# Patient Record
Sex: Female | Born: 1990 | Race: White | Hispanic: No | Marital: Single | Smoking: Never smoker
Health system: Southern US, Community
[De-identification: ages and names within clinical notes are randomized; demographics above are authoritative.]

## PROBLEM LIST (undated history)

## (undated) HISTORY — PX: WISDOM TOOTH EXTRACTION: SHX21

---
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
## 2012-01-05 IMAGING — CR DG CERVICAL SPINE COMPLETE 4+V
5 series · 5 of 5 positions shown · non-contrast
Comparison: None.

CLINICAL DATA: Neck pain left arm numbness.  No known injury.

CERVICAL SPINE - COMPLETE 4+ VIEW

[view not recorded (1 of 5)]
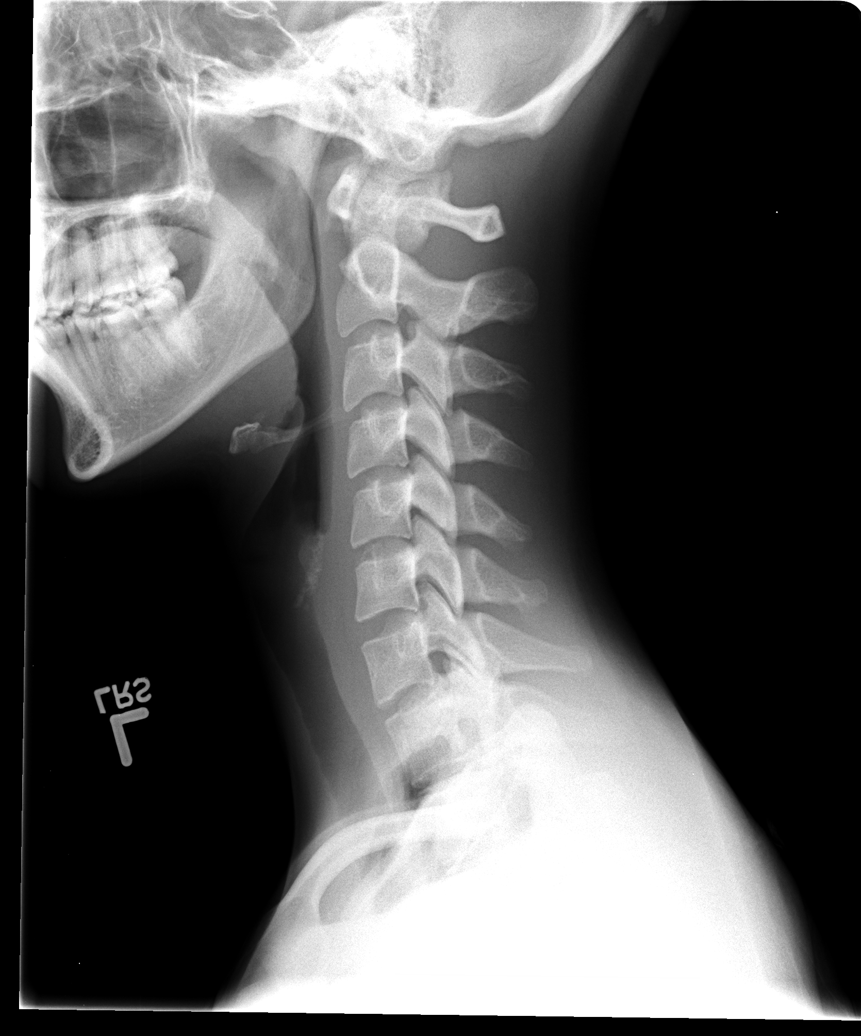

[view not recorded (2 of 5)]
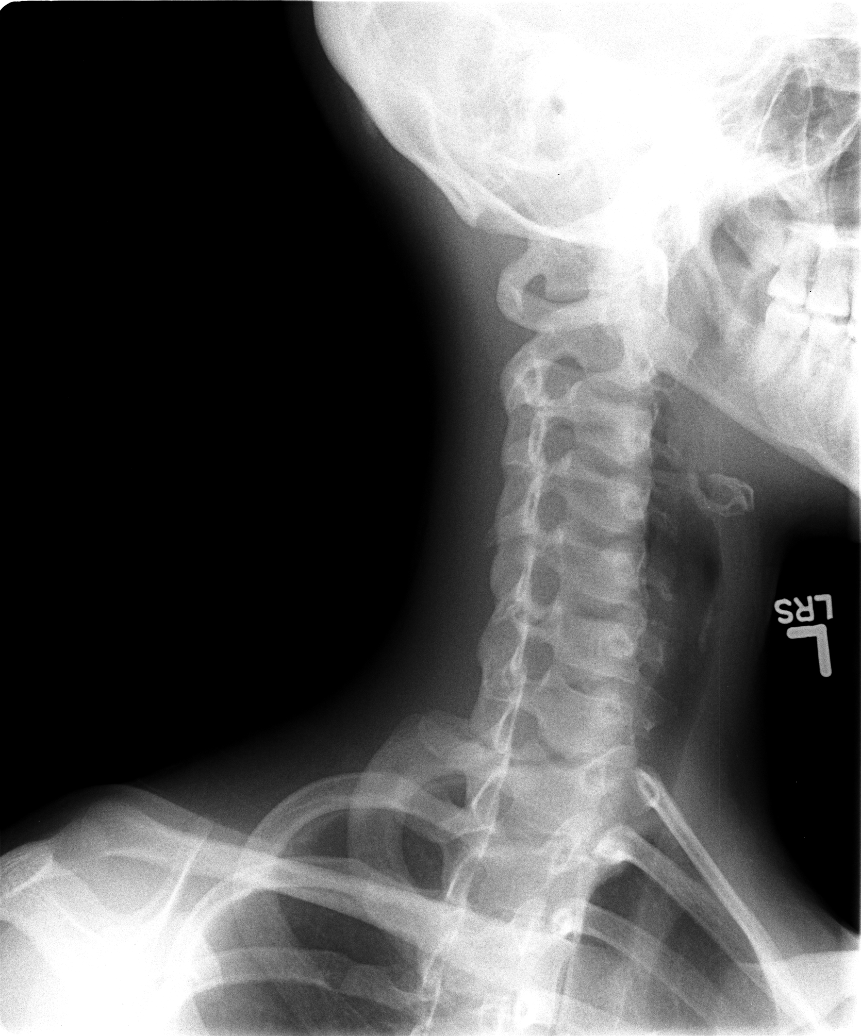

[view not recorded (3 of 5)]
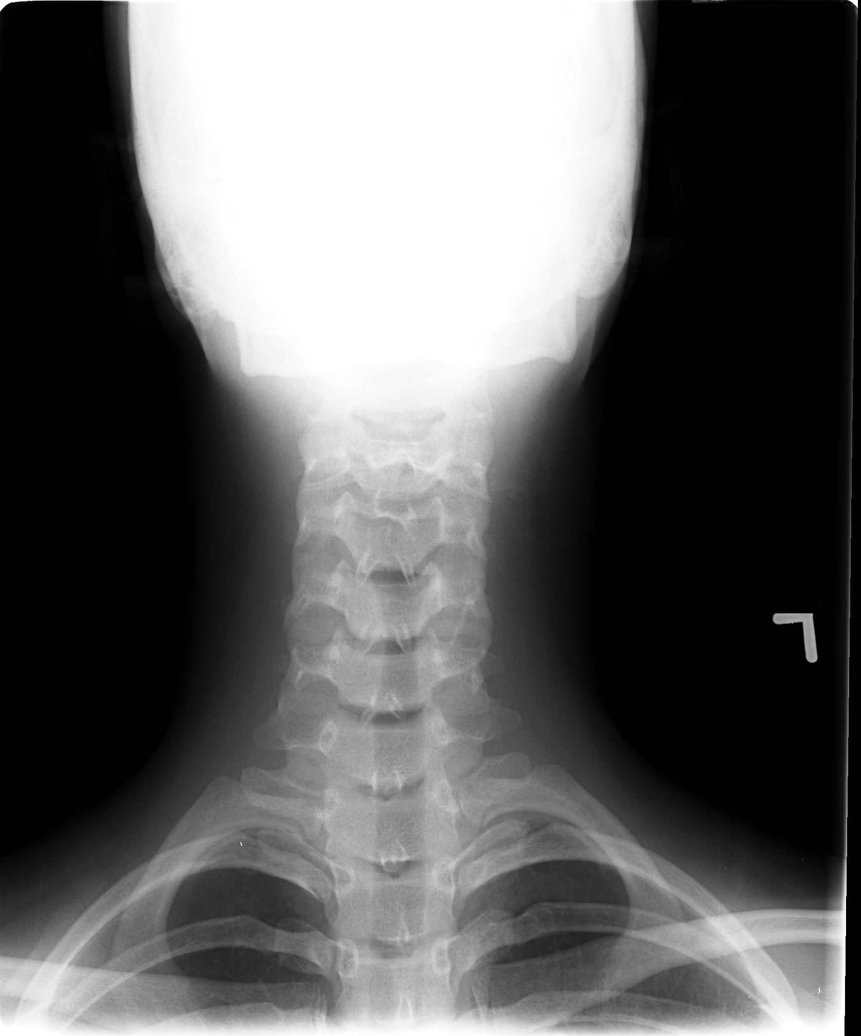

[view not recorded (4 of 5)]
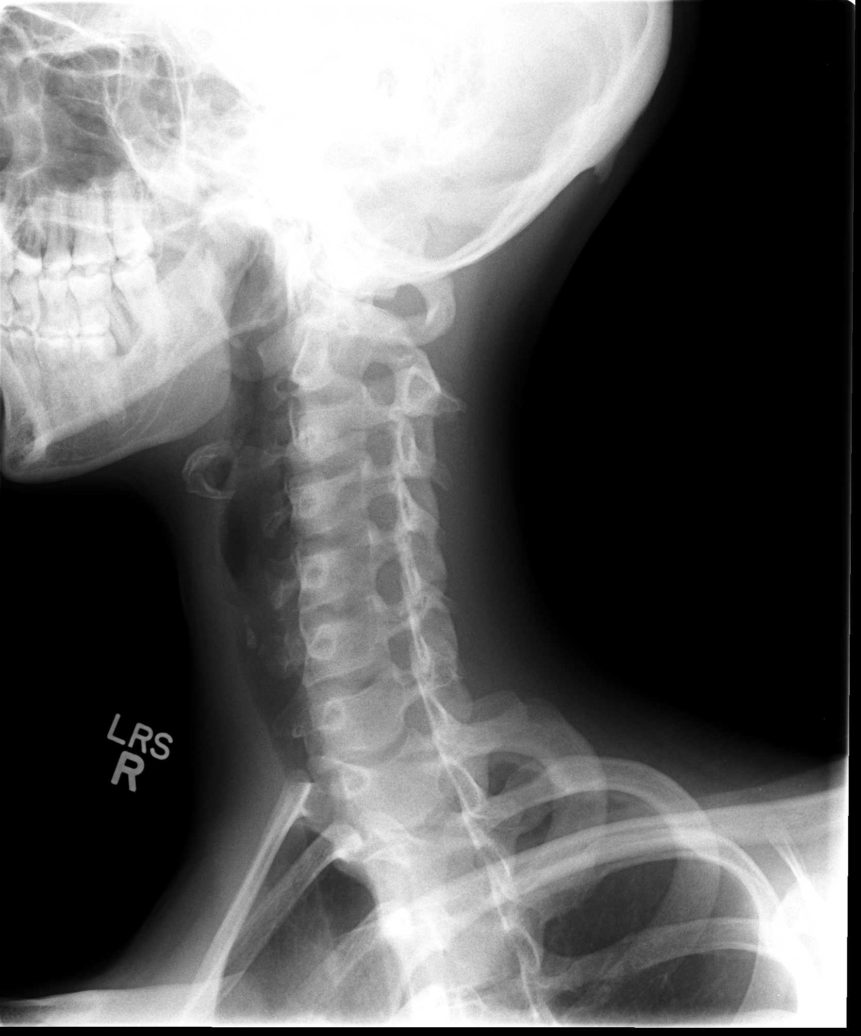

[view not recorded (5 of 5)]
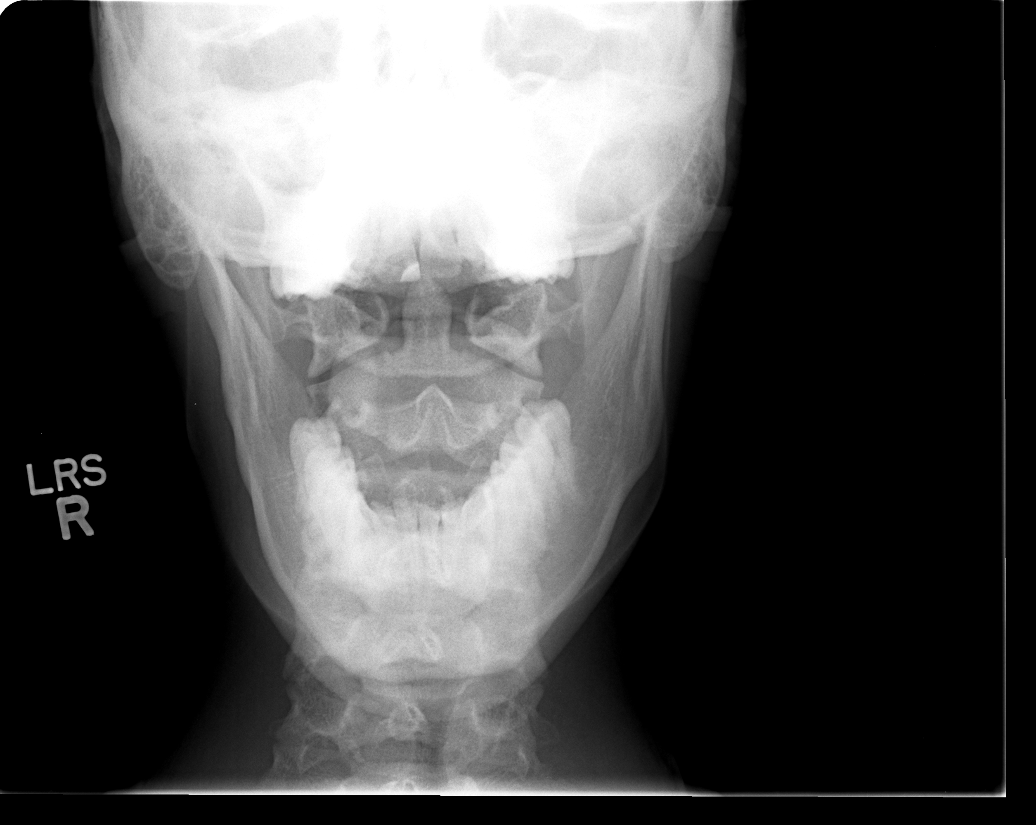

[5 of 5 positions shown; findings below may reference images not displayed]

FINDINGS: Normal appearing bones and soft tissues.  No bony
foraminal stenosis seen.
IMPRESSION: Normal examination.

## 2015-09-28 ENCOUNTER — Encounter (HOSPITAL_COMMUNITY): Payer: Self-pay | Admitting: Emergency Medicine

## 2015-09-28 ENCOUNTER — Emergency Department (HOSPITAL_COMMUNITY)
Admission: EM | Admit: 2015-09-28 | Discharge: 2015-09-28 | Disposition: A | Discharge status: Home / Self Care | Payer: Self-pay | Attending: Emergency Medicine | Admitting: Emergency Medicine

## 2015-09-28 DIAGNOSIS — Z3202 Encounter for pregnancy test, result negative: Secondary | ICD-10-CM | POA: Insufficient documentation

## 2015-09-28 DIAGNOSIS — R42 Dizziness and giddiness: Secondary | ICD-10-CM | POA: Insufficient documentation

## 2015-09-28 DIAGNOSIS — R0602 Shortness of breath: Secondary | ICD-10-CM | POA: Insufficient documentation

## 2015-09-28 DIAGNOSIS — H539 Unspecified visual disturbance: Secondary | ICD-10-CM | POA: Insufficient documentation

## 2015-09-28 DIAGNOSIS — R55 Syncope and collapse: Secondary | ICD-10-CM | POA: Insufficient documentation

## 2015-09-28 DIAGNOSIS — R5383 Other fatigue: Secondary | ICD-10-CM | POA: Insufficient documentation

## 2015-09-28 DIAGNOSIS — R531 Weakness: Secondary | ICD-10-CM | POA: Insufficient documentation

## 2015-09-28 LAB — BASIC METABOLIC PANEL
Anion gap: 8 (ref 5–15)
BUN: 13 mg/dL (ref 6–20)
CO2: 26 mmol/L (ref 22–32)
CREATININE: 0.64 mg/dL (ref 0.44–1.00)
Calcium: 9.1 mg/dL (ref 8.9–10.3)
Chloride: 104 mmol/L (ref 101–111)
GFR calc Af Amer: >60 mL/min (ref >60)
GLUCOSE: 90 mg/dL (ref 65–99)
Potassium: 3.6 mmol/L (ref 3.5–5.1)
SODIUM: 138 mmol/L (ref 135–145)

## 2015-09-28 LAB — CBC
HEMATOCRIT: 42.8 % (ref 36.0–46.0)
Hemoglobin: 14.1 g/dL (ref 12.0–15.0)
MCH: 30.4 pg (ref 26.0–34.0)
MCHC: 32.9 g/dL (ref 30.0–36.0)
MCV: 92.2 fL (ref 78.0–100.0)
PLATELETS: 224 10*3/uL (ref 150–400)
RBC: 4.64 MIL/uL (ref 3.87–5.11)
RDW: 13.1 % (ref 11.5–15.5)
WBC: 6.7 10*3/uL (ref 4.0–10.5)

## 2015-09-28 LAB — URINALYSIS, ROUTINE W REFLEX MICROSCOPIC
BILIRUBIN URINE: NEGATIVE
GLUCOSE, UA: NEGATIVE mg/dL
HGB URINE DIPSTICK: NEGATIVE
LEUKOCYTES UA: NEGATIVE
Nitrite: NEGATIVE
PROTEIN: NEGATIVE mg/dL
Specific Gravity, Urine: 1.015 (ref 1.005–1.030)
pH: 6.5 (ref 5.0–8.0)

## 2015-09-28 LAB — CBG MONITORING, ED
GLUCOSE-CAPILLARY: 86 mg/dL (ref 65–99)
Glucose-Capillary: 79 mg/dL (ref 65–99)

## 2015-09-28 LAB — PREGNANCY, URINE: Preg Test, Ur: NEGATIVE

## 2015-09-28 NOTE — ED Provider Notes (Signed)
CSN: [id number]     Arrival date & time 09/28/15  1431 History   First MD Initiated Contact with Patient 09/28/15 1542     Chief Complaint  Patient presents with  . Near Syncope     (Consider location/radiation/quality/duration/timing/severity/associated sxs/prior Treatment) Patient is a 24 y.o. female presenting with near-syncope. The history is provided by the patient.  Near Syncope Associated symptoms include shortness of breath. Pertinent negatives include no chest pain, no abdominal pain and no headaches.   patient works at [employer]. While at work patient started feeling like the room was spinning and she was going to pass out also found it hard to breathe. Reportedly blood sugar was checked there and it was over 200 but upon arrival here it was 86. Patient does not have a history of diabetes. Patient now feels back to normal other than feeling sort of fatigue. Patient denies any chest pain and shortness of breath now any abdominal pain. Any upper respiratory infection symptoms.  History reviewed. No pertinent past medical history. History reviewed. No pertinent past surgical history. History reviewed. No pertinent family history. Social History  Substance Use Topics  . Smoking status: Never Smoker   . Smokeless tobacco: None  . Alcohol Use: No   OB History    No data available     Review of Systems  Constitutional: Positive for fatigue. Negative for fever.  HENT: Negative for congestion and sore throat.   Eyes: Positive for visual disturbance.  Respiratory: Positive for shortness of breath.   Cardiovascular: Positive for near-syncope. Negative for chest pain.  Gastrointestinal: Negative for nausea, vomiting and abdominal pain.  Genitourinary: Negative for dysuria.  Musculoskeletal: Negative for myalgias.  Skin: Negative for rash.  Neurological: Positive for dizziness, weakness and light-headedness. Negative for syncope and headaches.  Hematological: Does not  bruise/bleed easily.  Psychiatric/Behavioral: Negative for confusion.      Allergies  Other  Home Medications   Prior to Admission medications   Not on File   BP 118/85 mmHg  Pulse 74  Temp(Src) 97.9 F (36.6 C) (Oral)  Resp 13  Ht 6' (1.829 m)  Wt 68.04 kg  BMI 20.34 kg/m2  SpO2 100%  LMP 09/12/2015 Physical Exam  Constitutional: She is oriented to person, place, and time. She appears well-developed and well-nourished. No distress.  HENT:  Head: Normocephalic and atraumatic.  Mouth/Throat: Oropharynx is clear and moist.  Eyes: Conjunctivae and EOM are normal. Pupils are equal, round, and reactive to light.  Neck: Normal range of motion. Neck supple.  Cardiovascular: Normal rate and normal heart sounds.   No murmur heard. Pulmonary/Chest: Effort normal and breath sounds normal. No respiratory distress.  Abdominal: Soft. Bowel sounds are normal. There is no tenderness.  Musculoskeletal: Normal range of motion. She exhibits no edema.  Neurological: She is alert and oriented to person, place, and time. No cranial nerve deficit. She exhibits normal muscle tone. Coordination normal.  Skin: Skin is warm. No rash noted.  Nursing note and vitals reviewed.   ED Course  Procedures (including critical care time) Labs Review Labs Reviewed  URINALYSIS, ROUTINE W REFLEX MICROSCOPIC (NOT AT [facility]) - Abnormal; Notable for the following:    Ketones, ur TRACE (*)    All other components within normal limits  BASIC METABOLIC PANEL  CBC  PREGNANCY, URINE  CBG MONITORING, ED  CBG MONITORING, ED   Results for orders placed or performed during the hospital encounter of 09/28/15  Basic metabolic panel  Result Value Ref Range  Sodium 138 135 - 145 mmol/L   Potassium 3.6 3.5 - 5.1 mmol/L   Chloride 104 101 - 111 mmol/L   CO2 26 22 - 32 mmol/L   Glucose, Bld 90 65 - 99 mg/dL   BUN 13 6 - 20 mg/dL   Creatinine, Ser 6.290.64 0.44 - 1.00 mg/dL   Calcium 9.1 8.9 - 52.810.3 mg/dL   GFR calc  non Af Amer >60 >60 mL/min   GFR calc Af Amer >60 >60 mL/min   Anion gap 8 5 - 15  CBC  Result Value Ref Range   WBC 6.7 4.0 - 10.5 K/uL   RBC 4.64 3.87 - 5.11 MIL/uL   Hemoglobin 14.1 12.0 - 15.0 g/dL   HCT 41.342.8 24.436.0 - 01.046.0 %   MCV 92.2 78.0 - 100.0 fL   MCH 30.4 26.0 - 34.0 pg   MCHC 32.9 30.0 - 36.0 g/dL   RDW 27.213.1 53.611.5 - 64.415.5 %   Platelets 224 150 - 400 K/uL  Urinalysis, Routine w reflex microscopic (not at [facility])  Result Value Ref Range   Color, Urine YELLOW YELLOW   APPearance CLEAR CLEAR   Specific Gravity, Urine 1.015 1.005 - 1.030   pH 6.5 5.0 - 8.0   Glucose, UA NEGATIVE NEGATIVE mg/dL   Hgb urine dipstick NEGATIVE NEGATIVE   Bilirubin Urine NEGATIVE NEGATIVE   Ketones, ur TRACE (A) NEGATIVE mg/dL   Protein, ur NEGATIVE NEGATIVE mg/dL   Nitrite NEGATIVE NEGATIVE   Leukocytes, UA NEGATIVE NEGATIVE  Pregnancy, urine  Result Value Ref Range   Preg Test, Ur NEGATIVE NEGATIVE  CBG monitoring, ED  Result Value Ref Range   Glucose-Capillary 86 65 - 99 mg/dL  CBG monitoring, ED  Result Value Ref Range   Glucose-Capillary 79 65 - 99 mg/dL     Imaging Review No results found. I have personally reviewed and evaluated these images and lab results as part of my medical decision-making.   EKG Interpretation   Date/Time:  Monday September 28 2015 16:16:35 EST Ventricular Rate:  67 PR Interval:  89 QRS Duration: 98 QT Interval:  406 QTC Calculation: 429 R Axis:   68 Text Interpretation:  Sinus rhythm Short PR interval RSR' in V1 or V2,  probably normal variant No previous ECGs available Confirmed by Kassidee Narciso   MD, Jaquin Coy [phone]([id number]) on 09/28/2015 4:20:29 PM      MDM   Final diagnoses:  Near syncope    Patient works at [employer]. Started feeling bad while at work at some room spinning and felt as if she was going to pass out. She found very hard to breathe. Here workup without any acute findings. Patient's now asymptomatic.  Lab test negative no signs of urinary  tract infection no anemia no leukocytosis no electrolyte abnormalities. EKG without any acute findings. Patient stable for discharge home will return for any recurrent symptoms. Prednisone test negative.    Vanetta MuldersScott Osha Rane, MD 09/28/15 [phone]

## 2015-09-28 NOTE — ED Notes (Signed)
Pt states that she was at work and started feeling like the room was spinning and that she was going to pass out.  States that it felt very hard to breathe.  Glucose was checked pta and was over 200.  CBG 86 in triage.

## 2015-09-28 NOTE — Discharge Instructions (Signed)
Return for any new worse or recurrent symptoms. Today's workup without any significant findings.   Near-Syncope Near-syncope (commonly known as near fainting) is sudden weakness, dizziness, or feeling like you might pass out. This can happen when getting up or while standing for a long time. It is caused by a sudden decrease in blood flow to the brain, which can occur for various reasons. Most of the reasons are not serious.  HOME CARE Watch your condition for any changes.  Have someone stay with you until you feel stable.  If you feel like you are going to pass out:  Lie down right away.  Prop your feet up if you can.  Breathe deeply and steadily.  Move only when the feeling has gone away. Most of the time, this feeling lasts only a few minutes. You may feel tired for several hours.  Drink enough fluids to keep your pee (urine) clear or pale yellow.  If you are taking blood pressure or heart medicine, stand up slowly.  Follow up with your doctor as told. GET HELP RIGHT AWAY IF:   You have a severe headache.  You have unusual pain in the chest, belly (abdomen), or back.  You have bleeding from the mouth or butt (rectum), or you have black or tarry poop (stool).  You feel your heart beat differently than normal, or you have a very fast pulse.  You pass out, or you twitch and shake when you pass out.  You pass out when sitting or lying down.  You feel confused.  You have trouble walking.  You are weak.  You have vision problems. MAKE SURE YOU:   Understand these instructions.  Will watch your condition.  Will get help right away if you are not doing well or get worse.   This information is not intended to replace advice given to you by your health care provider. Make sure you discuss any questions you have with your health care provider.   Document Released: 04/11/2008 Document Revised: 11/14/2014 Document Reviewed: 03/29/2013 Elsevier Interactive Patient  Education [employer]2016 Elsevier Inc.

## 2016-08-01 ENCOUNTER — Emergency Department (HOSPITAL_COMMUNITY)
Admission: EM | Admit: 2016-08-01 | Discharge: 2016-08-01 | Disposition: A | Discharge status: Home / Self Care | Payer: Medicaid Other | Attending: Emergency Medicine | Admitting: Emergency Medicine

## 2016-08-01 ENCOUNTER — Encounter (HOSPITAL_COMMUNITY): Payer: Self-pay | Admitting: Emergency Medicine

## 2016-08-01 DIAGNOSIS — Y929 Unspecified place or not applicable: Secondary | ICD-10-CM | POA: Diagnosis not present

## 2016-08-01 DIAGNOSIS — W57XXXA Bitten or stung by nonvenomous insect and other nonvenomous arthropods, initial encounter: Secondary | ICD-10-CM | POA: Insufficient documentation

## 2016-08-01 DIAGNOSIS — Y999 Unspecified external cause status: Secondary | ICD-10-CM | POA: Diagnosis not present

## 2016-08-01 DIAGNOSIS — Y939 Activity, unspecified: Secondary | ICD-10-CM | POA: Diagnosis not present

## 2016-08-01 DIAGNOSIS — L03116 Cellulitis of left lower limb: Secondary | ICD-10-CM | POA: Insufficient documentation

## 2016-08-01 DIAGNOSIS — S70362A Insect bite (nonvenomous), left thigh, initial encounter: Secondary | ICD-10-CM | POA: Diagnosis present

## 2016-08-01 NOTE — ED Triage Notes (Addendum)
PT states she got bite by ants on Saturday and went to Urgent care yesterday and was started on Clindamycin for red/raised area to back of left upper leg. PT denies any fevers. PT also states LMP was 07/02/16 with positive home pregnancy test.

## 2016-08-01 NOTE — ED Provider Notes (Signed)
[facility]-EMERGENCY DEPT Provider Note   CSN: [id number] Arrival date & time: 08/01/16  1739  By signing my name below, I, [name], attest that this documentation has been prepared under the direction and in the presence of Burgess AmorJulie Margret Moat, PA-C Electronically Signed: [name], ED Scribe. 08/01/16. 7:25 PM.    History   Chief Complaint Chief Complaint  Patient presents with  . Insect Bite   HPI  Kathy HaleBrianna M Austin is a 25 y.o. female who presents to the Emergency Department complaining of an insect bite onset yesterday morning which is itchy and tender. Pt denies feeling anything biting her, but  was possibly bit by ants to her posterior left upper leg while outside. Pt reports that she went to an urgent care yesterday and was Rx clindamycin for her symptoms. Pt states that the provider that she saw didn't I&D the area due to the area being hard. Pt notes that since she has begun the abx Rx, the area to her posterior left upper leg has become soft.  Pt reports that she has tried Rx clindamycin for the relief of her symptoms. Pt denies fever, chills, wound, and any other symptoms. Pt states that she is currently [redacted] weeks pregnant with her first OB appointment being 08/17/2016.     The history is provided by the patient. No language interpreter was used.    History reviewed. No pertinent past medical history.  There are no active problems to display for this patient.   Past Surgical History:  Procedure Laterality Date  . WISDOM TOOTH EXTRACTION      OB History    Gravida Para Term Preterm AB Living   3         1   SAB TAB Ectopic Multiple Live Births                   Home Medications    Prior to Admission medications   Not on File    Family History History reviewed. No pertinent family history.  Social History Social History  Substance Use Topics  . Smoking status: Never Smoker  . Smokeless tobacco: Never Used  . Alcohol use No     Allergies    Other   Review of Systems Review of Systems  Constitutional: Negative for chills and fever.  Skin: Positive for color change. Negative for wound.       Insect bite to posterior left upper thigh and right lower leg     Physical Exam Updated Vital Signs BP 160/99 (BP Location: Left Arm)   Pulse 92   Temp 98.2 F (36.8 C) (Oral)   Resp 16   Ht 6' (1.829 m)   Wt 66.7 kg   LMP 07/02/2016 (Approximate) Comment: positive tests at home  SpO2 99%   BMI 19.94 kg/m   Physical Exam  Constitutional: She appears well-developed and well-nourished. No distress.  HENT:  Head: Normocephalic.  Neck: Neck supple.  Cardiovascular: Normal rate.   Pulmonary/Chest: Effort normal. She has no wheezes.  Musculoskeletal: Normal range of motion. She exhibits no edema.  Skin: There is erythema.  Left posterior mid thigh with 11 cm circle of mild erythema with central darker punctum that is not draining, indurated, or fluctuant. Two small vesicles at central portion. No red streaking. 0.5 cm small papule at right medial ankle. No drainage. No fluctuance.      ED Treatments / Results  DIAGNOSTIC STUDIES: Oxygen Saturation is 99% on RA, nl by my interpretation.  COORDINATION OF CARE: 7:21 PM Discussed treatment plan with pt at bedside which includes continue with abx and pt agreed to plan.   Procedures Procedures (including critical care time)  Medications Ordered in ED Medications - No data to display   Initial Impression / Assessment and Plan / ED Course  I have reviewed the triage vital signs and the nursing notes.   Clinical Course     Pt advised to continue using abx Rx given ytd.  Pt can use topical menthol cream (like Gold [employer]) for itching and yogurt to minimize risk of diarrhea from abx. Wound edges marked with skin marker.  Advised recheck for any spreading redness. Heat tx to site.    Area is soft, not fluctuant, no abscess at this time.   Final Clinical Impressions(s)  / ED Diagnoses   Final diagnoses:  Cellulitis of left lower extremity    New Prescriptions There are no discharge medications for this patient.  I personally performed the services described in this documentation, which was scribed in my presence. The recorded information has been reviewed and is accurate.     Burgess Amor, PA-C 08/03/16 [id number]    Shaune Pollack, MD 08/03/16 [phone]

## 2023-10-13 ENCOUNTER — Other Ambulatory Visit: Payer: Self-pay

## 2023-10-13 ENCOUNTER — Inpatient Hospital Stay (HOSPITAL_COMMUNITY)
Admission: EM | Admit: 2023-10-13 | Discharge: 2023-10-16 | DRG: 872 | Disposition: A | Discharge status: Home / Self Care | Payer: 59 | Attending: Family Medicine | Admitting: Family Medicine

## 2023-10-13 ENCOUNTER — Emergency Department (HOSPITAL_COMMUNITY): Payer: 59

## 2023-10-13 DIAGNOSIS — Z881 Allergy status to other antibiotic agents status: Secondary | ICD-10-CM

## 2023-10-13 DIAGNOSIS — N10 Acute tubulo-interstitial nephritis: Secondary | ICD-10-CM | POA: Diagnosis present

## 2023-10-13 DIAGNOSIS — R739 Hyperglycemia, unspecified: Secondary | ICD-10-CM

## 2023-10-13 DIAGNOSIS — A4150 Gram-negative sepsis, unspecified: Principal | ICD-10-CM | POA: Diagnosis present

## 2023-10-13 DIAGNOSIS — R1011 Right upper quadrant pain: Secondary | ICD-10-CM | POA: Diagnosis not present

## 2023-10-13 DIAGNOSIS — N12 Tubulo-interstitial nephritis, not specified as acute or chronic: Principal | ICD-10-CM

## 2023-10-13 DIAGNOSIS — E871 Hypo-osmolality and hyponatremia: Secondary | ICD-10-CM | POA: Diagnosis present

## 2023-10-13 DIAGNOSIS — N390 Urinary tract infection, site not specified: Secondary | ICD-10-CM | POA: Diagnosis present

## 2023-10-13 DIAGNOSIS — Z8249 Family history of ischemic heart disease and other diseases of the circulatory system: Secondary | ICD-10-CM

## 2023-10-13 LAB — CBC WITH DIFFERENTIAL/PLATELET
Abs Immature Granulocytes: 0.05 10*3/uL (ref 0.00–0.07)
Basophils Absolute: 0 10*3/uL (ref 0.0–0.1)
Basophils Relative: 0 %
Eosinophils Absolute: 0 10*3/uL (ref 0.0–0.5)
Eosinophils Relative: 0 %
HCT: 43.4 % (ref 36.0–46.0)
Hemoglobin: 14.4 g/dL (ref 12.0–15.0)
Immature Granulocytes: 0 %
Lymphocytes Relative: 4 %
Lymphs Abs: 0.7 10*3/uL (ref 0.7–4.0)
MCH: 30.2 pg (ref 26.0–34.0)
MCHC: 33.2 g/dL (ref 30.0–36.0)
MCV: 91 fL (ref 80.0–100.0)
Monocytes Absolute: 1.4 10*3/uL — ABNORMAL HIGH (ref 0.1–1.0)
Monocytes Relative: 9 %
Neutro Abs: 13.7 10*3/uL — ABNORMAL HIGH (ref 1.7–7.7)
Neutrophils Relative %: 87 %
Platelets: 192 10*3/uL (ref 150–400)
RBC: 4.77 MIL/uL (ref 3.87–5.11)
RDW: 12.9 % (ref 11.5–15.5)
WBC: 15.8 10*3/uL — ABNORMAL HIGH (ref 4.0–10.5)
nRBC: 0 % (ref 0.0–0.2)

## 2023-10-13 LAB — COMPREHENSIVE METABOLIC PANEL
ALT: 14 U/L (ref 0–44)
AST: 11 U/L — ABNORMAL LOW (ref 15–41)
Albumin: 4.1 g/dL (ref 3.5–5.0)
Alkaline Phosphatase: 53 U/L (ref 38–126)
Anion gap: 10 (ref 5–15)
BUN: 11 mg/dL (ref 6–20)
CO2: 21 mmol/L — ABNORMAL LOW (ref 22–32)
Calcium: 8.9 mg/dL (ref 8.9–10.3)
Chloride: 103 mmol/L (ref 98–111)
Creatinine, Ser: 0.61 mg/dL (ref 0.44–1.00)
GFR, Estimated: >60 mL/min (ref >60)
Glucose, Bld: 132 mg/dL — ABNORMAL HIGH (ref 70–99)
Potassium: 3.6 mmol/L (ref 3.5–5.1)
Sodium: 134 mmol/L — ABNORMAL LOW (ref 135–145)
Total Bilirubin: 0.8 mg/dL (ref <1.2)
Total Protein: 7.3 g/dL (ref 6.5–8.1)

## 2023-10-13 LAB — APTT: aPTT: 29 s (ref 24–36)

## 2023-10-13 LAB — PROTIME-INR
INR: 1.1 (ref 0.8–1.2)
Prothrombin Time: 14.3 s (ref 11.4–15.2)

## 2023-10-13 LAB — LIPASE, BLOOD: Lipase: 26 U/L (ref 11–51)

## 2023-10-13 MED ORDER — ONDANSETRON HCL 4 MG/2ML IJ SOLN
4.0000 mg | Freq: Once | INTRAMUSCULAR | Status: AC
  Administered 2023-10-13: 4 mg via INTRAVENOUS
  Filled 2023-10-13: qty 2

## 2023-10-13 MED ORDER — MORPHINE SULFATE (PF) 4 MG/ML IV SOLN
4.0000 mg | Freq: Once | INTRAVENOUS | Status: AC
  Administered 2023-10-13: 4 mg via INTRAVENOUS
  Filled 2023-10-13: qty 1

## 2023-10-13 MED ORDER — LACTATED RINGERS IV BOLUS (SEPSIS)
1000.0000 mL | Freq: Once | INTRAVENOUS | Status: AC
Start: 2023-10-13 — End: 2023-10-14
  Administered 2023-10-13: 1000 mL via INTRAVENOUS

## 2023-10-13 NOTE — ED Triage Notes (Signed)
Pov from home. Right side pain since last night. Reports fever of  102 at the highest. hurts worse when you touch it.

## 2023-10-13 NOTE — ED Provider Notes (Signed)
[place] EMERGENCY DEPARTMENT AT [facility] Provider Note   CSN: [id number] Arrival date & time: 10/13/23  2300     History {Add pertinent medical, surgical, social history, OB history to HPI:1} Chief Complaint  Patient presents with   Abdominal Pain    R side    Kathy Austin is a 32 y.o. female.  The history is provided by the patient.  Abdominal Pain She complains of right upper quadrant pain for the last 24 hours.  Pain radiates to the epigastric area into the back.  There has been associated nausea but no vomiting.  She also has had fever to 102.5 with associated chills and sweats.  She has taken acetaminophen for her fever.  She denies any urinary difficulty.  She denies possibility of pregnancy since her husband has had a vasectomy and she denies sexual contact with anyone else.   Home Medications Prior to Admission medications   Not on File      Allergies    Cephalexin and Other    Review of Systems   Review of Systems  Gastrointestinal:  Positive for abdominal pain.  All other systems reviewed and are negative.   Physical Exam Updated Vital Signs BP (!) 138/90 (BP Location: Right Arm)   Pulse (!) 126   Temp 100.1 F (37.8 C) (Oral)   Resp 19   Ht 6' (1.829 m)   Wt 95.3 kg   LMP 09/26/2023 (Exact Date)   SpO2 97%   Breastfeeding No   BMI 28.48 kg/m  Physical Exam Vitals and nursing note reviewed.   32 year old female, resting comfortably and in no acute distress. Vital signs are significant for elevated heart rate. Oxygen saturation is 97%, which is normal. Head is normocephalic and atraumatic. PERRLA, EOMI. Oropharynx is clear. Neck is nontender and supple. Back is nontender and there is no CVA tenderness. Lungs are clear without rales, wheezes, or rhonchi. Chest is nontender. Heart has regular rate and rhythm without murmur. Abdomen is soft, flat, with moderate epigastric and right upper quadrant tenderness.  There is no rebound or  guarding.  Murphy sign is negative. Extremities have no cyanosis or edema, full range of motion is present. Skin is warm and dry without rash. Neurologic: Mental status is normal, moves all extremities equally.  ED Results / Procedures / Treatments   Labs (all labs ordered are listed, but only abnormal results are displayed) Labs Reviewed - No data to display  EKG None  Radiology No results found.  Procedures Procedures  Cardiac monitor shows sinus tachycardia, per my interpretation.  Medications Ordered in ED Medications - No data to display  ED Course/ Medical Decision Making/ A&P   {   Click here for ABCD2, HEART and other calculatorsREFRESH Note before signing :1}                              Medical Decision Making  Fever with right upper quadrant pain concerning for acute cholecystitis.  Differential diagnosis also includes, but is not limited to, pancreatitis, diverticulitis, pyelonephritis, appendicitis.  With fever and tachycardia, there is concern for sepsis.  I have initiated the evolving sepsis pathway and I have ordered bolus of lactated Ringer's solution.  I have ordered CT of abdomen and pelvis to look for signs of cholecystitis or pancreatitis or appendicitis.  {Document critical care time when appropriate:1} {Document review of labs and clinical decision tools ie heart score, Chads2Vasc2  etc:1}  {Document your independent review of radiology images, and any outside records:1} {Document your discussion with family members, caretakers, and with consultants:1} {Document social determinants of health affecting pt's care:1} {Document your decision making why or why not admission, treatments were needed:1} Final Clinical Impression(s) / ED Diagnoses Final diagnoses:  None    Rx / DC Orders ED Discharge Orders     None

## 2023-10-14 ENCOUNTER — Emergency Department (HOSPITAL_COMMUNITY): Payer: 59

## 2023-10-14 ENCOUNTER — Encounter (HOSPITAL_COMMUNITY): Payer: Self-pay

## 2023-10-14 DIAGNOSIS — N390 Urinary tract infection, site not specified: Secondary | ICD-10-CM | POA: Diagnosis present

## 2023-10-14 DIAGNOSIS — E871 Hypo-osmolality and hyponatremia: Secondary | ICD-10-CM

## 2023-10-14 DIAGNOSIS — A4150 Gram-negative sepsis, unspecified: Secondary | ICD-10-CM | POA: Diagnosis present

## 2023-10-14 DIAGNOSIS — R1011 Right upper quadrant pain: Secondary | ICD-10-CM | POA: Diagnosis present

## 2023-10-14 DIAGNOSIS — Z8249 Family history of ischemic heart disease and other diseases of the circulatory system: Secondary | ICD-10-CM | POA: Diagnosis not present

## 2023-10-14 DIAGNOSIS — N10 Acute tubulo-interstitial nephritis: Secondary | ICD-10-CM

## 2023-10-14 DIAGNOSIS — Z881 Allergy status to other antibiotic agents status: Secondary | ICD-10-CM | POA: Diagnosis not present

## 2023-10-14 LAB — URINALYSIS, W/ REFLEX TO CULTURE (INFECTION SUSPECTED)
Bilirubin Urine: NEGATIVE
Glucose, UA: NEGATIVE mg/dL
Ketones, ur: 20 mg/dL — AB
Nitrite: POSITIVE — AB
Protein, ur: 30 mg/dL — AB
Specific Gravity, Urine: 1.02 (ref 1.005–1.030)
pH: 6 (ref 5.0–8.0)

## 2023-10-14 LAB — CBC
HCT: 39.9 % (ref 36.0–46.0)
Hemoglobin: 12.9 g/dL (ref 12.0–15.0)
MCH: 30.1 pg (ref 26.0–34.0)
MCHC: 32.3 g/dL (ref 30.0–36.0)
MCV: 93 fL (ref 80.0–100.0)
Platelets: 169 10*3/uL (ref 150–400)
RBC: 4.29 MIL/uL (ref 3.87–5.11)
RDW: 12.8 % (ref 11.5–15.5)
WBC: 13.3 10*3/uL — ABNORMAL HIGH (ref 4.0–10.5)
nRBC: 0 % (ref 0.0–0.2)

## 2023-10-14 LAB — LACTIC ACID, PLASMA
Lactic Acid, Venous: 0.8 mmol/L (ref 0.5–1.9)
Lactic Acid, Venous: 0.9 mmol/L (ref 0.5–1.9)

## 2023-10-14 LAB — BASIC METABOLIC PANEL
Anion gap: 9 (ref 5–15)
BUN: 9 mg/dL (ref 6–20)
CO2: 22 mmol/L (ref 22–32)
Calcium: 8.5 mg/dL — ABNORMAL LOW (ref 8.9–10.3)
Chloride: 104 mmol/L (ref 98–111)
Creatinine, Ser: 0.64 mg/dL (ref 0.44–1.00)
GFR, Estimated: >60 mL/min (ref >60)
Glucose, Bld: 113 mg/dL — ABNORMAL HIGH (ref 70–99)
Potassium: 3.7 mmol/L (ref 3.5–5.1)
Sodium: 135 mmol/L (ref 135–145)

## 2023-10-14 LAB — PROCALCITONIN: Procalcitonin: <0.1 ng/mL

## 2023-10-14 LAB — HIV ANTIBODY (ROUTINE TESTING W REFLEX): HIV Screen 4th Generation wRfx: NONREACTIVE

## 2023-10-14 LAB — CORTISOL-AM, BLOOD: Cortisol - AM: 9.6 ug/dL (ref 6.7–22.6)

## 2023-10-14 LAB — PROTIME-INR
INR: 1.2 (ref 0.8–1.2)
Prothrombin Time: 14.9 s (ref 11.4–15.2)

## 2023-10-14 MED ORDER — MORPHINE SULFATE (PF) 2 MG/ML IV SOLN
2.0000 mg | INTRAVENOUS | Status: DC | PRN
  Administered 2023-10-15: 2 mg via INTRAVENOUS
  Filled 2023-10-14: qty 1

## 2023-10-14 MED ORDER — IOHEXOL 300 MG/ML  SOLN
100.0000 mL | Freq: Once | INTRAMUSCULAR | Status: AC | PRN
  Administered 2023-10-14: 100 mL via INTRAVENOUS

## 2023-10-14 MED ORDER — TRAZODONE HCL 50 MG PO TABS: 25.0000 mg | ORAL_TABLET | Freq: Every evening | ORAL | Status: DC | PRN

## 2023-10-14 MED ORDER — LACTATED RINGERS IV SOLN
150.0000 mL/h | INTRAVENOUS | Status: AC
  Administered 2023-10-14 (×3): 150 mL/h via INTRAVENOUS

## 2023-10-14 MED ORDER — KETOROLAC TROMETHAMINE 15 MG/ML IJ SOLN
15.0000 mg | Freq: Four times a day (QID) | INTRAMUSCULAR | Status: DC | PRN
  Administered 2023-10-14: 15 mg via INTRAVENOUS
  Filled 2023-10-14: qty 1

## 2023-10-14 MED ORDER — CHLORHEXIDINE GLUCONATE CLOTH 2 % EX PADS
6.0000 | MEDICATED_PAD | Freq: Every day | CUTANEOUS | Status: DC
  Administered 2023-10-14 – 2023-10-15 (×2): 6 via TOPICAL

## 2023-10-14 MED ORDER — ACETAMINOPHEN 325 MG PO TABS
650.0000 mg | ORAL_TABLET | Freq: Four times a day (QID) | ORAL | Status: DC | PRN
  Administered 2023-10-14 – 2023-10-15 (×3): 650 mg via ORAL
  Filled 2023-10-14 (×3): qty 2

## 2023-10-14 MED ORDER — ENOXAPARIN SODIUM 40 MG/0.4ML IJ SOSY
40.0000 mg | PREFILLED_SYRINGE | INTRAMUSCULAR | Status: DC
Start: 2023-10-14 — End: 2023-10-16
  Administered 2023-10-14 – 2023-10-16 (×2): 40 mg via SUBCUTANEOUS
  Filled 2023-10-14 (×2): qty 0.4

## 2023-10-14 MED ORDER — ACETAMINOPHEN 650 MG RE SUPP: 650.0000 mg | Freq: Four times a day (QID) | RECTAL | Status: DC | PRN

## 2023-10-14 MED ORDER — SODIUM CHLORIDE 0.9 % IV SOLN
2.0000 g | INTRAVENOUS | Status: DC
  Administered 2023-10-14 – 2023-10-16 (×3): 2 g via INTRAVENOUS
  Filled 2023-10-14 (×3): qty 20

## 2023-10-14 MED ORDER — SODIUM CHLORIDE 0.9 % IV SOLN: 1.0000 g | Freq: Three times a day (TID) | INTRAVENOUS | Status: DC

## 2023-10-14 MED ORDER — MAGNESIUM HYDROXIDE 400 MG/5ML PO SUSP
30.0000 mL | Freq: Every day | ORAL | Status: DC | PRN
  Administered 2023-10-15: 30 mL via ORAL
  Filled 2023-10-14: qty 30

## 2023-10-14 MED ORDER — ONDANSETRON HCL 4 MG PO TABS: 4.0000 mg | ORAL_TABLET | Freq: Four times a day (QID) | ORAL | Status: DC | PRN

## 2023-10-14 MED ORDER — ONDANSETRON HCL 4 MG/2ML IJ SOLN
4.0000 mg | Freq: Four times a day (QID) | INTRAMUSCULAR | Status: DC | PRN
  Administered 2023-10-15: 4 mg via INTRAVENOUS
  Filled 2023-10-14: qty 2

## 2023-10-14 MED ORDER — SODIUM CHLORIDE 0.9 % IV SOLN: 1.0000 g | Freq: Once | INTRAVENOUS | Status: DC

## 2023-10-14 MED ORDER — SODIUM CHLORIDE 0.9 % IV SOLN
1.0000 g | Freq: Three times a day (TID) | INTRAVENOUS | Status: DC
  Administered 2023-10-14: 1 g via INTRAVENOUS
  Filled 2023-10-14: qty 20

## 2023-10-14 NOTE — Hospital Course (Addendum)
Kathy Austin is a 32 y.o. female with no chronic medical history, who presented to the emergency room with acute onset of fever of 102.5 with right upper quadrant abdominal and flank pain that started today.  She admits to nausea without vomiting.  No dysuria, oliguria, urinary frequency or urgency or hematuria.  No cough or wheezing or dyspnea.  No chest pain or palpitations.  She denies any history of urolithiasis.    ED Course:  Tmax 102.5, HR 126 with temperature 100.1 with otherwise normal vital signs.  Lactic acid of 0.8 and later 0.9, hyponatremia 134, CO2 21 and otherwise unremarkable CMP.  CBC showed leukocytosis 15.8 with neutrophilia.  Blood cultures were drawn.  UA was positive for UTI. EKG :Sinus tachycardia with a rate of 109 with possible left atrial enlargement. Imaging: Portable chest x-ray showed no acute cardiopulmonary disease. Abdominal pelvic CT scan showed changes consistent with focal pyelonephritis within the right kidney with fullness in the right collecting system that is noted without definite stone.   The patient was given 4 mg of IV morphine sulfate, 4 mg of IV Zofran, 1 L bolus of IV lactated ringer and IV meropenem initially was ordered based on suspected allergy to Keflex however the patient has tolerated IV Rocephin before and therefore with pharmacy confirmation she was ordered IV Rocephin.       Assessment & Plan:   Principal Problem:   Sepsis due to gram-negative UTI [facility]) Active Problems:   Acute pyelonephritis   Hyponatremia   Assessment and Plan:   * Sepsis due to gram-negative UTI (HCC) -Sepsis physiology resolved, hemodynamically stable - Acute right pyelonephritis. - -Continue IV antibiotics of Rocephin -Pending blood/urine cultures >> no growth to date - S/p IV fluid hydration -Following labs, fever curve   Hyponatremia - Improved discontinue IV fluids -Likely due to sepsis, hypovolemia, metabolic acidosis   Acute pyelonephritis -  Management as above.

## 2023-10-14 NOTE — Discharge Instructions (Signed)
Providers Accepting New Patients in [place], [place]       [facility] [address]  [place], [place] [address] [phone] Accepts most insurances   [facility] [address] [place], [place] [address] [phone] Accepts most insurances   [facility] [address] [place], [place] [address]  [phone] Must meet requirements   [facility] [address] [place], [place] [address] [phone] Accepts most insurances   [facility] [address]  [place], [place] [address] [phone] Accepts most insurances   [facility] [facility] [address]   [place], [place]   [phone] Accepts most insurances   [facility])  [phone] [address]  [place], [place] [address] [phone] Accepts most insurances         [facility] [address]  [place], [place] [address] [phone] Accepts most insurances   [facility] [address] [place], [place] [address] [phone] option 1 Accepts Medicaid and [facility] [address]  [place], [place] [address] [phone] Accepts most insurances   Avon Gully, MD [address] [place], [place] [address] [phone] Accepts most insurances   [facility] at [facility] [address]  [place], [place] [address] [phone] Accepts most insurances   [facility] [phone] [address] [place], [place] [address] [phone] Accepts most insurances   [place], [place] [address] [place], [place] [address] [phone]  Accepts most insurances          Providers Accepting New Patients in [place], [place]       [facility] [address]  [place], [place] [address] [phone] Accepts most insurances   [facility] [address] [place],  [place] [address] [phone] Accepts most insurances   [facility] [address] [place], [place] [address]  [phone] Must meet requirements   [facility] [address] [place], [place] [address] [phone] Accepts most insurances   [facility] [address]  [place], [place] [address] [phone] Accepts most insurances   [facility] [address]   [place], [place]   [phone] Accepts most insurances   [facility])  [phone] [address]  [place], [place] [address] [phone] Accepts most insurances         [facility] [address]  [place], [place] [address] [phone] Accepts most insurances   [facility] [address] [place], [place] [address] [phone] option 1 Accepts Medicaid and [facility] [address]  [place], [place] [address] [phone] Accepts most insurances   Avon Gully, MD [address] [place], [place] [address] [phone] Accepts most insurances   [facility] at [facility] [address]  [place], [place] [address] [phone] Accepts most insurances   [facility] [phone] [address] [place], [place] [address] [phone] Accepts most insurances   [place], [place] [address] [place], [place] [address] [phone]  Accepts most insurances          Providers Accepting New Patients in [place], [place]       [facility] [address]  [place], [place] [address] [phone] Accepts most  insurances   [facility] [address] [place], [place] [address] [phone] Accepts most insurances   [facility] [address] [place], [place] [address]  [phone] Must meet requirements   [facility] [address] [place], [place]  [address] [phone] Accepts most insurances   [facility] [address]  [place], [place] [address] [phone] Accepts most insurances   [facility] [address]   [place], [place]   [phone] Accepts most insurances   [facility])  [phone] [address]  [place], [place] [address] [phone] Accepts most insurances         [facility] [address]  [place], [place] [address] [phone] Accepts most insurances   [facility] [address] [place], [place] [address] [phone] option 1 Accepts Medicaid and [facility] [address]  [place], [place] [address] [phone] Accepts most insurances   Avon Gully, MD [address] [place], [place] [address] [phone] Accepts most insurances   [facility] at [facility] [address]  [place], [place] [address] [phone] Accepts most insurances   [facility] [phone] [address] [place], [place] [address] [phone] Accepts most insurances   [place], [place] [address] [place], [place] [address] [phone]  Accepts most insurances

## 2023-10-14 NOTE — Progress Notes (Signed)
   10/14/23 1118  TOC Brief Assessment  Insurance and Status Reviewed  Patient has primary care physician No (NO PCP reported, provided list in AVS)  Home environment has been reviewed From home  Prior level of function: Independent  Prior/Current Home Services No current home services  Social Determinants of Health Reivew SDOH reviewed no interventions necessary  Readmission risk has been reviewed Yes  Transition of care needs no transition of care needs at this time       Transition of Care Department [facility]) has reviewed patient and no TOC needs have been identified at this time. We will continue to monitor patient advancement through interdisciplinary progression rounds. If new patient transition needs arise, please place a TOC consult.

## 2023-10-14 NOTE — Plan of Care (Signed)
  Problem: Education: Goal: Knowledge of General Education information will improve Description: Including pain rating scale, medication(s)/side effects and non-pharmacologic comfort measures Outcome: Progressing   Problem: Health Behavior/Discharge Planning: Goal: Ability to manage health-related needs will improve Outcome: Progressing   Problem: Clinical Measurements: Goal: Ability to maintain clinical measurements within normal limits will improve Outcome: Not Progressing Goal: Will remain free from infection Outcome: Progressing Goal: Diagnostic test results will improve Outcome: Progressing Goal: Respiratory complications will improve Outcome: Progressing Goal: Cardiovascular complication will be avoided Outcome: Progressing   Problem: Activity: Goal: Risk for activity intolerance will decrease Outcome: Progressing   Problem: Nutrition: Goal: Adequate nutrition will be maintained Outcome: Progressing   Problem: Coping: Goal: Level of anxiety will decrease Outcome: Progressing   Problem: Elimination: Goal: Will not experience complications related to bowel motility Outcome: Progressing Goal: Will not experience complications related to urinary retention Outcome: Progressing   Problem: Pain Management: Goal: General experience of comfort will improve Outcome: Progressing   Problem: Safety: Goal: Ability to remain free from injury will improve Outcome: Progressing   Problem: Skin Integrity: Goal: Risk for impaired skin integrity will decrease Outcome: Progressing   Problem: Fluid Volume: Goal: Hemodynamic stability will improve Outcome: Progressing   Problem: Clinical Measurements: Goal: Diagnostic test results will improve Outcome: Progressing Goal: Signs and symptoms of infection will decrease Outcome: Progressing

## 2023-10-14 NOTE — H&P (Signed)
[place]   PATIENT NAME: Kathy Austin    MR#:  [id number]  DATE OF BIRTH:  [birth year]  DATE OF ADMISSION:  10/13/2023  PRIMARY CARE PHYSICIAN: Patient, No Pcp Per   Patient is coming from: Home  REQUESTING/REFERRING PHYSICIAN:Glick, Onalee Hua, MD  CHIEF COMPLAINT:   Chief Complaint  Patient presents with   Abdominal Pain    R side    HISTORY OF PRESENT ILLNESS:  Kathy Austin is a 32 y.o. female with no chronic medical history, who presented to the emergency room with acute onset of fever of 102.5 with right upper quadrant abdominal and flank pain that started today.  She admits to nausea without vomiting.  No dysuria, oliguria, urinary frequency or urgency or hematuria.  No cough or wheezing or dyspnea.  No chest pain or palpitations.  She denies any history of urolithiasis.  ED Course: When she came to the ER, heart rate was 126 with temperature 100.1 with otherwise normal vital signs.  Labs revealed lactic acid of 0.8 and later 0.9, hyponatremia 134, CO2 21 and otherwise unremarkable CMP.  CBC showed leukocytosis 15.8 with neutrophilia.  Blood cultures were drawn.  UA was positive for UTI. EKG as reviewed by me : EKG showed sinus tachycardia with a rate of 109 with possible left atrial enlargement. Imaging: Portable chest x-ray showed no acute cardiopulmonary disease. Abdominal pelvic CT scan showed changes consistent with focal pyelonephritis within the right kidney with fullness in the right collecting system that is noted without definite stone.  The patient was given 4 mg of IV morphine sulfate, 4 mg of IV Zofran, 1 L bolus of IV lactated ringer and IV meropenem initially was ordered based on suspected allergy to Keflex however the patient has tolerated IV Rocephin before and therefore with pharmacy confirmation she was ordered IV Rocephin.  She will be admitted to a medical telemetry bed for further evaluation and management. PAST MEDICAL HISTORY:  History  reviewed. No pertinent past medical history.  No chronic medical problems.  PAST SURGICAL HISTORY:   Past Surgical History:  Procedure Laterality Date   WISDOM TOOTH EXTRACTION      SOCIAL HISTORY:   Social History   Tobacco Use   Smoking status: Never   Smokeless tobacco: Never  Substance Use Topics   Alcohol use: No    FAMILY HISTORY:   Positive for diabetes mellitus in her mother and coronary artery disease in her father.  DRUG ALLERGIES:   Allergies  Allergen Reactions   Cephalexin Hives and Rash    At same hospitalization, patient had 2 doses of CTX (1 prior to keflex and 1 dose after keflex reported allergy)   Other Shortness Of Breath    Muscle relaxers     REVIEW OF SYSTEMS:   ROS As per history of present illness. All pertinent systems were reviewed above. Constitutional, HEENT, cardiovascular, respiratory, GI, GU, musculoskeletal, neuro, psychiatric, endocrine, integumentary and hematologic systems were reviewed and are otherwise negative/unremarkable except for positive findings mentioned above in the HPI.   MEDICATIONS AT HOME:   Prior to Admission medications   Not on File      VITAL SIGNS:  Blood pressure 128/84, pulse 100, temperature 98.3 F (36.8 C), temperature source Oral, resp. rate 16, height 6' (1.829 m), weight 93.8 kg, last menstrual period 09/26/2023, SpO2 100%, not currently breastfeeding.  PHYSICAL EXAMINATION:  Physical Exam  GENERAL:  32 y.o.-year-old Caucasian female patient lying in the bed with no acute distress.  EYES: Pupils equal, round, reactive to light and accommodation. No scleral icterus. Extraocular muscles intact.  HEENT: Head atraumatic, normocephalic. Oropharynx and nasopharynx clear.  NECK:  Supple, no jugular venous distention. No thyroid enlargement, no tenderness.  LUNGS: Normal breath sounds bilaterally, no wheezing, rales,rhonchi or crepitation. No use of accessory muscles of respiration.  CARDIOVASCULAR:  Regular rate and rhythm, S1, S2 normal. No murmurs, rubs, or gallops.  ABDOMEN: Soft, nondistended, with mild epigastric and right upper quadrant tenderness.  Negative Murphy sign.. Bowel sounds present. No organomegaly or mass.  Right CVA tenderness. EXTREMITIES: No pedal edema, cyanosis, or clubbing.  NEUROLOGIC: Cranial nerves II through XII are intact. Muscle strength 5/5 in all extremities. Sensation intact. Gait not checked.  PSYCHIATRIC: The patient is alert and oriented x 3.  Normal affect and good eye contact. SKIN: No obvious rash, lesion, or ulcer.   LABORATORY PANEL:   CBC Recent Labs  Lab 10/13/23 2331  WBC 15.8*  HGB 14.4  HCT 43.4  PLT 192   ------------------------------------------------------------------------------------------------------------------  Chemistries  Recent Labs  Lab 10/13/23 2331  NA 134*  K 3.6  CL 103  CO2 21*  GLUCOSE 132*  BUN 11  CREATININE 0.61  CALCIUM 8.9  AST 11*  ALT 14  ALKPHOS 53  BILITOT 0.8   ------------------------------------------------------------------------------------------------------------------  Cardiac Enzymes No results for input(s): "TROPONINI" in the last 168 hours. ------------------------------------------------------------------------------------------------------------------  RADIOLOGY:  CT ABDOMEN PELVIS W CONTRAST  Result Date: 10/14/2023 CLINICAL DATA:  Right-sided abdominal pain for 2 days, initial encounter EXAM: CT ABDOMEN AND PELVIS WITH CONTRAST TECHNIQUE: Multidetector CT imaging of the abdomen and pelvis was performed using the standard protocol following bolus administration of intravenous contrast. RADIATION DOSE REDUCTION: This exam was performed according to the departmental dose-optimization program which includes automated exposure control, adjustment of the mA and/or kV according to patient size and/or use of iterative reconstruction technique. CONTRAST:  OMNIPAQUE IOHEXOL 300 MG/ML   SOLN COMPARISON:  None Available. FINDINGS: Lower chest: No acute abnormality. Hepatobiliary: No focal liver abnormality is seen. No gallstones, gallbladder wall thickening, or biliary dilatation. Pancreas: Unremarkable. No pancreatic ductal dilatation or surrounding inflammatory changes. Spleen: Normal in size without focal abnormality. Adrenals/Urinary Tract: Adrenal glands are within normal limits. Left kidney is unremarkable. Right kidney demonstrates some mild perinephric stranding. Fullness of the right collecting system and right ureter is noted although no obstructing stone is seen. Some patchy enhancement is noted in the midportion of the right kidney suspicious for focal pyelonephritis. Correlate with laboratory values. Bladder is well distended. Stomach/Bowel: No obstructive or inflammatory changes of the colon are seen. The appendix is within normal limits. Small bowel and stomach are unremarkable. Vascular/Lymphatic: No significant vascular findings are present. No enlarged abdominal or pelvic lymph nodes. Reproductive: Uterus and bilateral adnexa are unremarkable. Other: No abdominal wall hernia or abnormality. Mild free fluid is noted within the pelvis likely physiologic in nature. Musculoskeletal: No acute or significant osseous findings. IMPRESSION: Changes consistent with focal pyelonephritis within the right kidney. Fullness of the right collecting system is noted without definitive stone. Electronically Signed   By: Alcide Clever M.D.   On: 10/14/2023 01:28   DG Chest Port 1 View  Result Date: 10/14/2023 CLINICAL DATA:  Questionable sepsis - evaluate for abnormality. Right chest pain, fever EXAM: PORTABLE CHEST 1 VIEW COMPARISON:  None Available. FINDINGS: The heart size and mediastinal contours are within normal limits. Both lungs are clear. The visualized skeletal structures are unremarkable. IMPRESSION: Normal study. Electronically Signed  By: Charlett Nose M.D.   On: 10/14/2023 00:00       IMPRESSION AND PLAN:  Assessment and Plan: * Sepsis due to gram-negative UTI [facility]) - This is clearly secondary to acute right pyelonephritis. - The patient will be admitted to a medical telemetry bed. - Will continue antibiotic therapy with IV Rocephin as tolerated. - We will follow blood and urine cultures. - We will continue hydration with IV lactated ringer. - Sepsis is manifested by leukocytosis and tachycardia as well as earlier fever of 102.5.  Hyponatremia - This is likely hypovolemic due to her nausea and especially with associated mild metabolic acidosis. - She will be hydrated with IV lactate ringer and we will follow BMP.  Acute pyelonephritis - Management as above.   DVT prophylaxis: Lovenox.  Advanced Care Planning:  Code Status: full code.  Family Communication:  The plan of care was discussed in details with the patient (and family). I answered all questions. The patient agreed to proceed with the above mentioned plan. Further management will depend upon hospital course. Disposition Plan: Back to previous home environment Consults called: none.  All the records are reviewed and case discussed with ED provider.  Status is: Inpatient   At the time of the admission, it appears that the appropriate admission status for this patient is inpatient.  This is judged to be reasonable and necessary in order to provide the required intensity of service to ensure the patient's safety given the presenting symptoms, physical exam findings and initial radiographic and laboratory data in the context of comorbid conditions.  The patient requires inpatient status due to high intensity of service, high risk of further deterioration and high frequency of surveillance required.  I certify that at the time of admission, it is my clinical judgment that the patient will require inpatient hospital care extending more than 2 midnights.                            Dispo: The patient is  from: Home              Anticipated d/c is to: Home              Patient currently is not medically stable to d/c.              Difficult to place patient: No  Hannah Beat M.D on 10/14/2023 at 6:12 AM  [facility]   From 7 PM-7 AM, contact night-coverage [email or web address]  CC: Primary care physician; Patient, No Pcp Per

## 2023-10-14 NOTE — Plan of Care (Signed)

## 2023-10-14 NOTE — Assessment & Plan Note (Signed)
-   This is likely hypovolemic due to her nausea and especially with associated mild metabolic acidosis. - She will be hydrated with IV lactate ringer and we will follow BMP.

## 2023-10-14 NOTE — Assessment & Plan Note (Addendum)
-   This is clearly secondary to acute right pyelonephritis. - The patient will be admitted to a medical telemetry bed. - Will continue antibiotic therapy with IV Rocephin as tolerated. - We will follow blood and urine cultures. - We will continue hydration with IV lactated ringer. - Sepsis is manifested by leukocytosis and tachycardia as well as earlier fever of 102.5.

## 2023-10-14 NOTE — Assessment & Plan Note (Signed)
 Management as above

## 2023-10-14 NOTE — Progress Notes (Signed)
PROGRESS NOTE    Patient: Kathy Austin                            PCP: Patient, No Pcp Per                    DOB: [birth year]            DOA: 10/13/2023 ZOX:[id number]             DOS: 10/14/2023, 9:04 AM   LOS: 0 days   Date of Service: The patient was seen and examined on 10/14/2023  Subjective:   The patient was seen and examined this morning. Hemodynamically stable. No issues overnight .  Brief Narrative:   Kathy Austin is a 32 y.o. female with no chronic medical history, who presented to the emergency room with acute onset of fever of 102.5 with right upper quadrant abdominal and flank pain that started today.  She admits to nausea without vomiting.  No dysuria, oliguria, urinary frequency or urgency or hematuria.  No cough or wheezing or dyspnea.  No chest pain or palpitations.  She denies any history of urolithiasis.    ED Course:  Tmax 102.5, HR 126 with temperature 100.1 with otherwise normal vital signs.  Lactic acid of 0.8 and later 0.9, hyponatremia 134, CO2 21 and otherwise unremarkable CMP.  CBC showed leukocytosis 15.8 with neutrophilia.  Blood cultures were drawn.  UA was positive for UTI. EKG :Sinus tachycardia with a rate of 109 with possible left atrial enlargement. Imaging: Portable chest x-ray showed no acute cardiopulmonary disease. Abdominal pelvic CT scan showed changes consistent with focal pyelonephritis within the right kidney with fullness in the right collecting system that is noted without definite stone.   The patient was given 4 mg of IV morphine sulfate, 4 mg of IV Zofran, 1 L bolus of IV lactated ringer and IV meropenem initially was ordered based on suspected allergy to Keflex however the patient has tolerated IV Rocephin before and therefore with pharmacy confirmation she was ordered IV Rocephin.       Assessment and Plan:   * Sepsis due to gram-negative UTI [facility]) - This is clearly secondary to acute right pyelonephritis. - The patient  will be admitted to a medical telemetry bed. - Will continue antibiotic therapy with IV Rocephin as tolerated. - We will follow blood and urine cultures. - We will continue hydration with IV lactated ringer. - Sepsis is manifested by leukocytosis and tachycardia as well as earlier fever of 102.5.   Hyponatremia - This is likely hypovolemic due to her nausea and especially with associated mild metabolic acidosis. - She will be hydrated with IV lactate ringer and we will follow BMP.   Acute pyelonephritis - Management as above.    ----------------------------------------------------------------------------------------------------------------------------------------------- Nutritional status:  The patient's BMI is: Body mass index is 28.05 kg/m. I agree with the assessment and plan as outlined --------------------------------------------------------------------------------------------------------------------------------------------- Cultures; Urine Culture  >>> NGT   ---------------------------------------------------------------------------------------------------------------------------------------------  DVT prophylaxis:  enoxaparin (LOVENOX) injection 40 mg Start: 10/14/23 1000   Code Status:   Code Status: Full Code  Family Communication: No family member present at bedside -Advance care planning has been discussed.   Admission status:   Status is: Inpatient Remains inpatient appropriate because: Needing IV fluids, IV antibiotics   Disposition: From  - home             Planning for discharge in 1-2 days: to Home  Procedures:   No admission procedures for hospital encounter.   Antimicrobials:  Anti-infectives (From admission, onward)    Start     Dose/Rate Route Frequency Ordered Stop   10/14/23 1000  cefTRIAXone (ROCEPHIN) 2 g in sodium chloride 0.9 % 100 mL IVPB        2 g 200 mL/hr over 30 Minutes Intravenous Every 24 hours 10/14/23 0312     10/14/23 0600   meropenem (MERREM) 1 g in sodium chloride 0.9 % 100 mL IVPB  Status:  Discontinued        1 g 200 mL/hr over 30 Minutes Intravenous Every 8 hours 10/14/23 0154 10/14/23 0212   10/14/23 0245  meropenem (MERREM) 1 g in sodium chloride 0.9 % 100 mL IVPB  Status:  Discontinued        1 g 200 mL/hr over 30 Minutes Intravenous  Once 10/14/23 0238 10/14/23 0240   10/14/23 0215  meropenem (MERREM) 1 g in sodium chloride 0.9 % 100 mL IVPB  Status:  Discontinued        1 g 200 mL/hr over 30 Minutes Intravenous Every 8 hours 10/14/23 0212 10/14/23 4098        Medication:   Chlorhexidine Gluconate Cloth  6 each Topical Daily   enoxaparin (LOVENOX) injection  40 mg Subcutaneous Q24H    acetaminophen **OR** acetaminophen, ketorolac, magnesium hydroxide, morphine injection, ondansetron **OR** ondansetron (ZOFRAN) IV, traZODone   Objective:   Vitals:   10/14/23 0300 10/14/23 0400 10/14/23 0741 10/14/23 0800  BP: (!) 133/92 128/84  122/72  Pulse: (!) 105 100  (!) 112  Resp: 14 16  (!) 24  Temp:   98.3 F (36.8 C)   TempSrc:   Oral   SpO2: 100% 100%  100%  Weight:      Height:        Intake/Output Summary (Last 24 hours) at 10/14/2023 1191 Last data filed at 10/14/2023 0501 Gross per 24 hour  Intake 1407.69 ml  Output --  Net 1407.69 ml   Filed Weights   10/13/23 2310 10/14/23 0257  Weight: 95.3 kg 93.8 kg     Physical examination:   Constitution:  Alert, cooperative, no distress,  Appears calm and comfortable  Psychiatric:   Normal and stable mood and affect, cognition intact,   HEENT:        Normocephalic, PERRL, otherwise with in Normal limits  Chest:         Chest symmetric Cardio vascular:  S1/S2, RRR, No murmure, No Rubs or Gallops  pulmonary: Clear to auscultation bilaterally, respirations unlabored, negative wheezes / crackles Abdomen: + RT CVA tenderness Soft, non-distended, bowel sounds,no masses, no organomegaly Muscular skeletal: Limited exam - in bed, able to move  all 4 extremities,   Neuro: CNII-XII intact. , normal motor and sensation, reflexes intact  Extremities: No pitting edema lower extremities, +2 pulses  Skin: Dry, warm to touch, negative for any Rashes, No open wounds Wounds: per nursing documentation   ------------------------------------------------------------------------------------------------------------------------------------------    LABs:     Latest Ref Rng & Units 10/14/2023    6:10 AM 10/13/2023   11:31 PM 09/28/2015    3:05 PM  CBC  WBC 4.0 - 10.5 K/uL 13.3  15.8  6.7   Hemoglobin 12.0 - 15.0 g/dL 47.8  29.5  62.1   Hematocrit 36.0 - 46.0 % 39.9  43.4  42.8   Platelets 150 - 400 K/uL 169  192  224       Latest Ref Rng &  Units 10/14/2023    6:10 AM 10/13/2023   11:31 PM 09/28/2015    3:05 PM  CMP  Glucose 70 - 99 mg/dL 119  147  90   BUN 6 - 20 mg/dL 9  11  13    Creatinine 0.44 - 1.00 mg/dL 8.29  5.62  1.30   Sodium 135 - 145 mmol/L 135  134  138   Potassium 3.5 - 5.1 mmol/L 3.7  3.6  3.6   Chloride 98 - 111 mmol/L 104  103  104   CO2 22 - 32 mmol/L 22  21  26    Calcium 8.9 - 10.3 mg/dL 8.5  8.9  9.1   Total Protein 6.5 - 8.1 g/dL  7.3    Total Bilirubin <1.2 mg/dL  0.8    Alkaline Phos 38 - 126 U/L  53    AST 15 - 41 U/L  11    ALT 0 - 44 U/L  14         Micro Results Recent Results (from the past 240 hour(s))  Blood Culture (routine x 2)     Status: None (Preliminary result)   Collection Time: 10/13/23 11:46 PM   Specimen: Right Antecubital; Blood  Result Value Ref Range Status   Specimen Description RIGHT ANTECUBITAL  Final   Special Requests   Final    BOTTLES DRAWN AEROBIC AND ANAEROBIC Blood Culture adequate volume   Culture   Final    NO GROWTH < 12 HOURS Performed at [facility], [address] [place], [place] [address]    Report Status PENDING  Incomplete  Blood Culture (routine x 2)     Status: None (Preliminary result)   Collection Time: 10/13/23 11:52 PM   Specimen: Left Antecubital;  Blood  Result Value Ref Range Status   Specimen Description LEFT ANTECUBITAL  Final   Special Requests   Final    BOTTLES DRAWN AEROBIC AND ANAEROBIC Blood Culture adequate volume   Culture   Final    NO GROWTH < 12 HOURS Performed at [facility], [address] [place], [place] [address]    Report Status PENDING  Incomplete    Radiology Reports CT ABDOMEN PELVIS W CONTRAST  Result Date: 10/14/2023 CLINICAL DATA:  Right-sided abdominal pain for 2 days, initial encounter EXAM: CT ABDOMEN AND PELVIS WITH CONTRAST TECHNIQUE: Multidetector CT imaging of the abdomen and pelvis was performed using the standard protocol following bolus administration of intravenous contrast. RADIATION DOSE REDUCTION: This exam was performed according to the departmental dose-optimization program which includes automated exposure control, adjustment of the mA and/or kV according to patient size and/or use of iterative reconstruction technique. CONTRAST:  OMNIPAQUE IOHEXOL 300 MG/ML  SOLN COMPARISON:  None Available. FINDINGS: Lower chest: No acute abnormality. Hepatobiliary: No focal liver abnormality is seen. No gallstones, gallbladder wall thickening, or biliary dilatation. Pancreas: Unremarkable. No pancreatic ductal dilatation or surrounding inflammatory changes. Spleen: Normal in size without focal abnormality. Adrenals/Urinary Tract: Adrenal glands are within normal limits. Left kidney is unremarkable. Right kidney demonstrates some mild perinephric stranding. Fullness of the right collecting system and right ureter is noted although no obstructing stone is seen. Some patchy enhancement is noted in the midportion of the right kidney suspicious for focal pyelonephritis. Correlate with laboratory values. Bladder is well distended. Stomach/Bowel: No obstructive or inflammatory changes of the colon are seen. The appendix is within normal limits. Small bowel and stomach are unremarkable. Vascular/Lymphatic: No  significant vascular findings are present. No enlarged  abdominal or pelvic lymph nodes. Reproductive: Uterus and bilateral adnexa are unremarkable. Other: No abdominal wall hernia or abnormality. Mild free fluid is noted within the pelvis likely physiologic in nature. Musculoskeletal: No acute or significant osseous findings. IMPRESSION: Changes consistent with focal pyelonephritis within the right kidney. Fullness of the right collecting system is noted without definitive stone. Electronically Signed   By: Alcide Clever M.D.   On: 10/14/2023 01:28   DG Chest Port 1 View  Result Date: 10/14/2023 CLINICAL DATA:  Questionable sepsis - evaluate for abnormality. Right chest pain, fever EXAM: PORTABLE CHEST 1 VIEW COMPARISON:  None Available. FINDINGS: The heart size and mediastinal contours are within normal limits. Both lungs are clear. The visualized skeletal structures are unremarkable. IMPRESSION: Normal study. Electronically Signed   By: Charlett Nose M.D.   On: 10/14/2023 00:00    SIGNED: Kendell Bane, MD, FHM. FAAFP. [facility] - [facility] Time spent - 35 min.  In seeing, evaluating and examining the patient. Reviewing medical records, labs, drawn plan of care. [facility],  Pager (please use [email or web address] to page/ text) Please use Epic Secure Chat for non-urgent communication (7AM-7PM)  If 7PM-7AM, please contact night-coverage [email or web address], 10/14/2023, 9:04 AM

## 2023-10-15 ENCOUNTER — Encounter (HOSPITAL_COMMUNITY): Payer: Self-pay | Admitting: Family Medicine

## 2023-10-15 DIAGNOSIS — A4150 Gram-negative sepsis, unspecified: Secondary | ICD-10-CM | POA: Diagnosis not present

## 2023-10-15 DIAGNOSIS — N390 Urinary tract infection, site not specified: Secondary | ICD-10-CM | POA: Diagnosis not present

## 2023-10-15 LAB — CBC
HCT: 38.6 % (ref 36.0–46.0)
Hemoglobin: 12.4 g/dL (ref 12.0–15.0)
MCH: 29.9 pg (ref 26.0–34.0)
MCHC: 32.1 g/dL (ref 30.0–36.0)
MCV: 93 fL (ref 80.0–100.0)
Platelets: 170 10*3/uL (ref 150–400)
RBC: 4.15 MIL/uL (ref 3.87–5.11)
RDW: 12.5 % (ref 11.5–15.5)
WBC: 11.1 10*3/uL — ABNORMAL HIGH (ref 4.0–10.5)
nRBC: 0 % (ref 0.0–0.2)

## 2023-10-15 LAB — MRSA NEXT GEN BY PCR, NASAL: MRSA by PCR Next Gen: NOT DETECTED

## 2023-10-15 MED ORDER — ORAL CARE MOUTH RINSE: 15.0000 mL | OROMUCOSAL | Status: DC | PRN

## 2023-10-15 MED ORDER — GUAIFENESIN-DM 100-10 MG/5ML PO SYRP
10.0000 mL | ORAL_SOLUTION | Freq: Three times a day (TID) | ORAL | Status: DC
  Administered 2023-10-15: 10 mL via ORAL
  Filled 2023-10-15: qty 10

## 2023-10-15 NOTE — Progress Notes (Signed)
PROGRESS NOTE    Patient: Kathy Austin                            PCP: Patient, No Pcp Per                    DOB: Sep 29, 1991            DOA: 10/13/2023 ZOX:[id number]             DOS: 10/15/2023, 11:19 AM   LOS: 1 day   Date of Service: The patient was seen and examined on 10/15/2023  Subjective:   The patient was seen and examined this morning, stable no acute distress, hemodynamically stable. No issues overnight,  Brief Narrative:   Kathy Austin is a 32 y.o. female with no chronic medical history, who presented to the emergency room with acute onset of fever of 102.5 with right upper quadrant abdominal and flank pain that started today.  She admits to nausea without vomiting.  No dysuria, oliguria, urinary frequency or urgency or hematuria.  No cough or wheezing or dyspnea.  No chest pain or palpitations.  She denies any history of urolithiasis.    ED Course:  Tmax 102.5, HR 126 with temperature 100.1 with otherwise normal vital signs.  Lactic acid of 0.8 and later 0.9, hyponatremia 134, CO2 21 and otherwise unremarkable CMP.  CBC showed leukocytosis 15.8 with neutrophilia.  Blood cultures were drawn.  UA was positive for UTI. EKG :Sinus tachycardia with a rate of 109 with possible left atrial enlargement. Imaging: Portable chest x-ray showed no acute cardiopulmonary disease. Abdominal pelvic CT scan showed changes consistent with focal pyelonephritis within the right kidney with fullness in the right collecting system that is noted without definite stone.   The patient was given 4 mg of IV morphine sulfate, 4 mg of IV Zofran, 1 L bolus of IV lactated ringer and IV meropenem initially was ordered based on suspected allergy to Keflex however the patient has tolerated IV Rocephin before and therefore with pharmacy confirmation she was ordered IV Rocephin.       Assessment & Plan:   Principal Problem:   Sepsis due to gram-negative UTI [facility]) Active Problems:   Acute  pyelonephritis   Hyponatremia   Assessment and Plan:   * Sepsis due to gram-negative UTI (HCC) -Sepsis physiology resolved, hemodynamically stable - Acute right pyelonephritis. - -Continue IV antibiotics of Rocephin -Pending blood/urine cultures >> no growth to date - S/p IV fluid hydration -Following labs, fever curve   Hyponatremia - Improved discontinue IV fluids -Likely due to sepsis, hypovolemia, metabolic acidosis   Acute pyelonephritis - Management as above.    ----------------------------------------------------------------------------------------------------------------------------------------------- Nutritional status:  The patient's BMI is: Body mass index is 28.05 kg/m. I agree with the assessment and plan as outlined --------------------------------------------------------------------------------------------------------------------------------------------- Cultures; Urine Culture  >>> NGT   ---------------------------------------------------------------------------------------------------------------------------------------------  DVT prophylaxis:  enoxaparin (LOVENOX) injection 40 mg Start: 10/14/23 1000   Code Status:   Code Status: Full Code  Family Communication: No family member present at bedside -Advance care planning has been discussed.   Admission status:   Status is: Inpatient Remains inpatient appropriate because: Needing IV fluids, IV antibiotics   Disposition: From  - home             Planning for discharge in 1 days: to Home   Procedures:   No admission procedures for hospital encounter.   Antimicrobials:  Anti-infectives (From admission, onward)  Start     Dose/Rate Route Frequency Ordered Stop   10/14/23 1000  cefTRIAXone (ROCEPHIN) 2 g in sodium chloride 0.9 % 100 mL IVPB        2 g 200 mL/hr over 30 Minutes Intravenous Every 24 hours 10/14/23 0312     10/14/23 0600  meropenem (MERREM) 1 g in sodium chloride 0.9 % 100 mL  IVPB  Status:  Discontinued        1 g 200 mL/hr over 30 Minutes Intravenous Every 8 hours 10/14/23 0154 10/14/23 0212   10/14/23 0245  meropenem (MERREM) 1 g in sodium chloride 0.9 % 100 mL IVPB  Status:  Discontinued        1 g 200 mL/hr over 30 Minutes Intravenous  Once 10/14/23 0238 10/14/23 0240   10/14/23 0215  meropenem (MERREM) 1 g in sodium chloride 0.9 % 100 mL IVPB  Status:  Discontinued        1 g 200 mL/hr over 30 Minutes Intravenous Every 8 hours 10/14/23 0212 10/14/23 1610        Medication:   Chlorhexidine Gluconate Cloth  6 each Topical Daily   enoxaparin (LOVENOX) injection  40 mg Subcutaneous Q24H    acetaminophen **OR** acetaminophen, ketorolac, magnesium hydroxide, morphine injection, ondansetron **OR** ondansetron (ZOFRAN) IV, traZODone   Objective:   Vitals:   10/15/23 0515 10/15/23 0800 10/15/23 0900 10/15/23 1020  BP:    (!) 141/84  Pulse: 96  (!) 109 92  Resp: 18  17 (!) 26  Temp:  98.5 F (36.9 C)    TempSrc:  Oral    SpO2: 99%  98% 99%  Weight:      Height:        Intake/Output Summary (Last 24 hours) at 10/15/2023 1119 Last data filed at 10/15/2023 1054 Gross per 24 hour  Intake 2459.04 ml  Output 850 ml  Net 1609.04 ml   Filed Weights   10/13/23 2310 10/14/23 0257  Weight: 95.3 kg 93.8 kg     Physical examination:        General:  AAO x 3,  cooperative, no distress;   HEENT:  Normocephalic, PERRL, otherwise with in Normal limits   Neuro:  CNII-XII intact. , normal motor and sensation, reflexes intact   Lungs:   Clear to auscultation BL, Respirations unlabored,  No wheezes / crackles  Cardio:    S1/S2, RRR, No murmure, No Rubs or Gallops   Abdomen:  Soft, non-tender, bowel sounds active all four quadrants, no guarding or peritoneal signs.  Muscular  skeletal:  Improved CVA tenderness, Limited exam -global generalized weaknesses - in bed, able to move all 4 extremities,   2+ pulses,  symmetric, No pitting edema  Skin:  Dry,  warm to touch, negative for any Rashes,  Wounds: Please see nursing documentation          ------------------------------------------------------------------------------------------------------------------------------------------    LABs:     Latest Ref Rng & Units 10/15/2023    5:23 AM 10/14/2023    6:10 AM 10/13/2023   11:31 PM  CBC  WBC 4.0 - 10.5 K/uL 11.1  13.3  15.8   Hemoglobin 12.0 - 15.0 g/dL 96.0  45.4  09.8   Hematocrit 36.0 - 46.0 % 38.6  39.9  43.4   Platelets 150 - 400 K/uL 170  169  192       Latest Ref Rng & Units 10/14/2023    6:10 AM 10/13/2023   11:31 PM 09/28/2015    3:05 PM  CMP  Glucose 70 - 99 mg/dL 829  562  90   BUN 6 - 20 mg/dL 9  11  13    Creatinine 0.44 - 1.00 mg/dL 1.30  8.65  7.84   Sodium 135 - 145 mmol/L 135  134  138   Potassium 3.5 - 5.1 mmol/L 3.7  3.6  3.6   Chloride 98 - 111 mmol/L 104  103  104   CO2 22 - 32 mmol/L 22  21  26    Calcium 8.9 - 10.3 mg/dL 8.5  8.9  9.1   Total Protein 6.5 - 8.1 g/dL  7.3    Total Bilirubin <1.2 mg/dL  0.8    Alkaline Phos 38 - 126 U/L  53    AST 15 - 41 U/L  11    ALT 0 - 44 U/L  14         Micro Results Recent Results (from the past 240 hour(s))  Blood Culture (routine x 2)     Status: None (Preliminary result)   Collection Time: 10/13/23 11:46 PM   Specimen: Right Antecubital; Blood  Result Value Ref Range Status   Specimen Description RIGHT ANTECUBITAL  Final   Special Requests   Final    BOTTLES DRAWN AEROBIC AND ANAEROBIC Blood Culture adequate volume   Culture   Final    NO GROWTH 2 DAYS Performed at [facility], [address], [place], [place] [address]    Report Status PENDING  Incomplete  Blood Culture (routine x 2)     Status: None (Preliminary result)   Collection Time: 10/13/23 11:52 PM   Specimen: Left Antecubital; Blood  Result Value Ref Range Status   Specimen Description LEFT ANTECUBITAL  Final   Special Requests   Final    BOTTLES DRAWN AEROBIC AND ANAEROBIC Blood  Culture adequate volume   Culture   Final    NO GROWTH 2 DAYS Performed at [facility], [address], [place], [place] [address]    Report Status PENDING  Incomplete  MRSA Next Gen by PCR, Nasal     Status: None   Collection Time: 10/14/23  2:37 AM   Specimen: Nasal Mucosa; Nasal Swab  Result Value Ref Range Status   MRSA by PCR Next Gen NOT DETECTED NOT DETECTED Final    Comment: (NOTE) The GeneXpert MRSA Assay (FDA approved for NASAL specimens only), is one component of a comprehensive MRSA colonization surveillance program. It is not intended to diagnose MRSA infection nor to guide or monitor treatment for MRSA infections. Test performance is not FDA approved in patients less than 61 years old. Performed at [facility], [address], [place], [place] [address]     Radiology Reports No results found.  SIGNED: Kendell Bane, MD, FHM. FAAFP. [facility] - [facility] Time spent - 35 min.  In seeing, evaluating and examining the patient. Reviewing medical records, labs, drawn plan of care. [facility],  Pager (please use [email or web address] to page/ text) Please use Epic Secure Chat for non-urgent communication (7AM-7PM)  If 7PM-7AM, please contact night-coverage [email or web address], 10/15/2023, 11:19 AM

## 2023-10-15 NOTE — Plan of Care (Signed)

## 2023-10-15 NOTE — Plan of Care (Addendum)
Patient remains in AP-2B at time of writing. VS WNL and stable. No supplemental O2 or MIVF. Patient endorses mild tenderness to right flank; denies NVD. Education assessment documented for this encounter overnight. Patient declined scheduled dose of guaifenesin overnight. Adult oral care protocol initiated by this RN, per protocol. Order for continuous cardiac monitoring expired overnight.    Problem: Education: Goal: Knowledge of General Education information will improve Description: Including pain rating scale, medication(s)/side effects and non-pharmacologic comfort measures Outcome: Progressing   Problem: Health Behavior/Discharge Planning: Goal: Ability to manage health-related needs will improve Outcome: Progressing   Problem: Clinical Measurements: Goal: Ability to maintain clinical measurements within normal limits will improve Outcome: Progressing Goal: Will remain free from infection Outcome: Progressing Goal: Diagnostic test results will improve Outcome: Progressing Goal: Respiratory complications will improve Outcome: Progressing Goal: Cardiovascular complication will be avoided Outcome: Progressing   Problem: Activity: Goal: Risk for activity intolerance will decrease Outcome: Progressing   Problem: Nutrition: Goal: Adequate nutrition will be maintained Outcome: Progressing   Problem: Coping: Goal: Level of anxiety will decrease Outcome: Progressing   Problem: Elimination: Goal: Will not experience complications related to bowel motility Outcome: Progressing Goal: Will not experience complications related to urinary retention Outcome: Progressing   Problem: Pain Management: Goal: General experience of comfort will improve Outcome: Progressing   Problem: Safety: Goal: Ability to remain free from injury will improve Outcome: Progressing   Problem: Skin Integrity: Goal: Risk for impaired skin integrity will decrease Outcome: Progressing   Problem:  Fluid Volume: Goal: Hemodynamic stability will improve Outcome: Progressing   Problem: Clinical Measurements: Goal: Diagnostic test results will improve Outcome: Progressing Goal: Signs and symptoms of infection will decrease Outcome: Progressing   Problem: Respiratory: Goal: Ability to maintain adequate ventilation will improve Outcome: Progressing

## 2023-10-16 DIAGNOSIS — A4150 Gram-negative sepsis, unspecified: Secondary | ICD-10-CM | POA: Diagnosis not present

## 2023-10-16 DIAGNOSIS — N390 Urinary tract infection, site not specified: Secondary | ICD-10-CM | POA: Diagnosis not present

## 2023-10-16 LAB — CBC
HCT: 40.4 % (ref 36.0–46.0)
Hemoglobin: 13 g/dL (ref 12.0–15.0)
MCH: 30 pg (ref 26.0–34.0)
MCHC: 32.2 g/dL (ref 30.0–36.0)
MCV: 93.1 fL (ref 80.0–100.0)
Platelets: 215 10*3/uL (ref 150–400)
RBC: 4.34 MIL/uL (ref 3.87–5.11)
RDW: 12.7 % (ref 11.5–15.5)
WBC: 8.6 10*3/uL (ref 4.0–10.5)
nRBC: 0 % (ref 0.0–0.2)

## 2023-10-16 LAB — URINE CULTURE: Culture: <10000 — AB

## 2023-10-16 MED ORDER — CIPROFLOXACIN HCL 500 MG PO TABS: 500.0000 mg | ORAL_TABLET | Freq: Two times a day (BID) | ORAL | 0 refills | Status: AC

## 2023-10-16 MED ORDER — LACTINEX PO CHEW: 1.0000 | CHEWABLE_TABLET | Freq: Three times a day (TID) | ORAL | 0 refills | Status: AC

## 2023-10-16 MED ORDER — FLUCONAZOLE 150 MG PO TABS: 150.0000 mg | ORAL_TABLET | Freq: Every day | ORAL | 1 refills | Status: AC

## 2023-10-16 NOTE — Progress Notes (Signed)
Ng Discharge Note  Admit Date:  10/13/2023 Discharge date: 10/16/2023   Kathy Austin to be D/C'd Home per MD order.  AVS completed. Patient/caregiver able to verbalize understanding.  Discharge Medication: Allergies as of 10/16/2023       Reactions   Cephalexin Hives, Rash   At same hospitalization, patient had 2 doses of CTX (1 prior to keflex and 1 dose after keflex reported allergy)   Other Shortness Of Breath   Muscle relaxers         Medication List     TAKE these medications    ciprofloxacin 500 MG tablet Commonly known as: Cipro Take 1 tablet (500 mg total) by mouth 2 (two) times daily for 10 days.   fluconazole 150 MG tablet Commonly known as: Diflucan Take 1 tablet (150 mg total) by mouth daily for 2 days.   lactobacillus acidophilus & bulgar chewable tablet Chew 1 tablet by mouth 3 (three) times daily with meals for 10 days.        Discharge Assessment: Vitals:   10/16/23 0755 10/16/23 0847  BP:  (!) 134/94  Pulse:  99  Resp:  20  Temp: 98.1 F (36.7 C) 98.6 F (37 C)  SpO2:  99%   Skin clean, dry and intact without evidence of skin break down, no evidence of skin tears noted. IV catheter discontinued intact. Site without signs and symptoms of complications - no redness or edema noted at insertion site, patient denies c/o pain - only slight tenderness at site.  Dressing with slight pressure applied.  D/c Instructions-Education: Discharge instructions given to patient/family with verbalized understanding. D/c education completed with patient/family including follow up instructions, medication list, d/c activities limitations if indicated, with other d/c instructions as indicated by MD - patient able to verbalize understanding, all questions fully answered. Patient instructed to return to ED, call 911, or call MD for any changes in condition.  Patient escorted via WC, and D/C home via private auto.  Kathy Ford, LPN 12/12/8525 7:82 PM

## 2023-10-16 NOTE — Discharge Summary (Signed)
Physician Discharge Summary   Patient: Kathy Austin MRN: [id number] DOB: [birth year]  Admit date:     10/13/2023  Discharge date: 10/16/23  Discharge Physician: Kendell Bane   PCP: Patient, No Pcp Per   Recommendations at discharge:   Follow-up with your primary gynecologist for scheduled routine exam, Pap smear Repeat UA as needed Continue completing antibiotic course If recurrent UTI likely will need urology referral  Discharge Diagnoses: Principal Problem:   Sepsis due to gram-negative UTI [facility]) Active Problems:   Acute pyelonephritis   Hyponatremia  Resolved Problems:   * No resolved hospital problems. *   Kathy Austin is a 32 y.o. female with no chronic medical history, who presented to the emergency room with acute onset of fever of 102.5 with right upper quadrant abdominal and flank pain that started today.  She admits to nausea without vomiting.  No dysuria, oliguria, urinary frequency or urgency or hematuria.  No cough or wheezing or dyspnea.  No chest pain or palpitations.  She denies any history of urolithiasis.      ED Course:  Tmax 102.5, HR 126 with temperature 100.1 with otherwise normal vital signs.  Lactic acid of 0.8 and later 0.9, hyponatremia 134, CO2 21 and otherwise unremarkable CMP.  CBC showed leukocytosis 15.8 with neutrophilia.  Blood cultures were drawn.  UA was positive for UTI. EKG :Sinus tachycardia with a rate of 109 with possible left atrial enlargement. Imaging: Portable chest x-ray showed no acute cardiopulmonary disease. Abdominal pelvic CT scan showed changes consistent with focal pyelonephritis within the right kidney with fullness in the right collecting system that is noted without definite stone.   The patient was given 4 mg of IV morphine sulfate, 4 mg of IV Zofran, 1 L bolus of IV lactated ringer and IV meropenem initially was ordered based on suspected allergy to Keflex however the patient has tolerated IV Rocephin before and  therefore with pharmacy confirmation she was ordered IV Rocephin.            * Sepsis due to gram-negative UTI (HCC) -Sepsis physiology resolved, hemodynamically stable - Acute right pyelonephritis. - -Continue IV antibiotics of Rocephin -switching to p.o. antibiotics of ciprofloxacin -Blood cultures-no growth to date -Urine culture<10K insignificant growth colonies - S/p IV fluid hydration -Hemodynamically stable, afebrile, normotensive -Recommended follow-up with her GYN     Hyponatremia - Resolved - discontinue IV fluids -Likely due to sepsis, hypovolemia, metabolic acidosis   Acute pyelonephritis - Management as above.       ----------------------------------------------------------------------------------------------------------------------------------------------- Nutritional status:  The patient's BMI is: Body mass index is 28.05 kg/m. I agree with the assessment and plan as outlined --------------------------------------------------------------------------------------------------------------------------------------------- Cultures; Urine Culture  >>> <10K colony insignificant growth Blood cultures x 2 -no growth to date   Hospital Course: Disposition: Home Diet recommendation:  Regular diet DISCHARGE MEDICATION: Allergies as of 10/16/2023       Reactions   Cephalexin Hives, Rash   At same hospitalization, patient had 2 doses of CTX (1 prior to keflex and 1 dose after keflex reported allergy)   Other Shortness Of Breath   Muscle relaxers         Medication List     TAKE these medications    ciprofloxacin 500 MG tablet Commonly known as: Cipro Take 1 tablet (500 mg total) by mouth 2 (two) times daily for 10 days.   fluconazole 150 MG tablet Commonly known as: Diflucan Take 1 tablet (150 mg total) by mouth daily for 2 days.  lactobacillus acidophilus & bulgar chewable tablet Chew 1 tablet by mouth 3 (three) times daily with meals for 10 days.         Discharge Exam: Filed Weights   10/13/23 2310 10/14/23 0257  Weight: 95.3 kg 93.8 kg        General:  AAO x 3,  cooperative, no distress;   HEENT:  Normocephalic, PERRL, otherwise with in Normal limits   Neuro:  CNII-XII intact. , normal motor and sensation, reflexes intact   Lungs:   Clear to auscultation BL, Respirations unlabored,  No wheezes / crackles  Cardio:    S1/S2, RRR, No murmure, No Rubs or Gallops   Abdomen:  Soft, non-tender, bowel sounds active all four quadrants, no guarding or peritoneal signs.  Muscular  skeletal:  Limited exam -global generalized weaknesses - in bed, able to move all 4 extremities,   2+ pulses,  symmetric, No pitting edema  Skin:  Dry, warm to touch, negative for any Rashes,  Wounds: Please see nursing documentation          Condition at discharge: good  The results of significant diagnostics from this hospitalization (including imaging, microbiology, ancillary and laboratory) are listed below for reference.   Imaging Studies: CT ABDOMEN PELVIS W CONTRAST  Result Date: 10/14/2023 CLINICAL DATA:  Right-sided abdominal pain for 2 days, initial encounter EXAM: CT ABDOMEN AND PELVIS WITH CONTRAST TECHNIQUE: Multidetector CT imaging of the abdomen and pelvis was performed using the standard protocol following bolus administration of intravenous contrast. RADIATION DOSE REDUCTION: This exam was performed according to the departmental dose-optimization program which includes automated exposure control, adjustment of the mA and/or kV according to patient size and/or use of iterative reconstruction technique. CONTRAST:  OMNIPAQUE IOHEXOL 300 MG/ML  SOLN COMPARISON:  None Available. FINDINGS: Lower chest: No acute abnormality. Hepatobiliary: No focal liver abnormality is seen. No gallstones, gallbladder wall thickening, or biliary dilatation. Pancreas: Unremarkable. No pancreatic ductal dilatation or surrounding inflammatory changes.  Spleen: Normal in size without focal abnormality. Adrenals/Urinary Tract: Adrenal glands are within normal limits. Left kidney is unremarkable. Right kidney demonstrates some mild perinephric stranding. Fullness of the right collecting system and right ureter is noted although no obstructing stone is seen. Some patchy enhancement is noted in the midportion of the right kidney suspicious for focal pyelonephritis. Correlate with laboratory values. Bladder is well distended. Stomach/Bowel: No obstructive or inflammatory changes of the colon are seen. The appendix is within normal limits. Small bowel and stomach are unremarkable. Vascular/Lymphatic: No significant vascular findings are present. No enlarged abdominal or pelvic lymph nodes. Reproductive: Uterus and bilateral adnexa are unremarkable. Other: No abdominal wall hernia or abnormality. Mild free fluid is noted within the pelvis likely physiologic in nature. Musculoskeletal: No acute or significant osseous findings. IMPRESSION: Changes consistent with focal pyelonephritis within the right kidney. Fullness of the right collecting system is noted without definitive stone. Electronically Signed   By: Alcide Clever M.D.   On: 10/14/2023 01:28   DG Chest Port 1 View  Result Date: 10/14/2023 CLINICAL DATA:  Questionable sepsis - evaluate for abnormality. Right chest pain, fever EXAM: PORTABLE CHEST 1 VIEW COMPARISON:  None Available. FINDINGS: The heart size and mediastinal contours are within normal limits. Both lungs are clear. The visualized skeletal structures are unremarkable. IMPRESSION: Normal study. Electronically Signed   By: Charlett Nose M.D.   On: 10/14/2023 00:00    Microbiology: Results for orders placed or performed during the hospital encounter of 10/13/23  Blood  Culture (routine x 2)     Status: None (Preliminary result)   Collection Time: 10/13/23 11:46 PM   Specimen: Right Antecubital; Blood  Result Value Ref Range Status   Specimen  Description RIGHT ANTECUBITAL  Final   Special Requests   Final    BOTTLES DRAWN AEROBIC AND ANAEROBIC Blood Culture adequate volume   Culture   Final    NO GROWTH 3 DAYS Performed at [facility], [address], [place], [place] [address]    Report Status PENDING  Incomplete  Blood Culture (routine x 2)     Status: None (Preliminary result)   Collection Time: 10/13/23 11:52 PM   Specimen: Left Antecubital; Blood  Result Value Ref Range Status   Specimen Description LEFT ANTECUBITAL  Final   Special Requests   Final    BOTTLES DRAWN AEROBIC AND ANAEROBIC Blood Culture adequate volume   Culture   Final    NO GROWTH 3 DAYS Performed at [facility], [address], [place], [place] [address]    Report Status PENDING  Incomplete  MRSA Next Gen by PCR, Nasal     Status: None   Collection Time: 10/14/23  2:37 AM   Specimen: Nasal Mucosa; Nasal Swab  Result Value Ref Range Status   MRSA by PCR Next Gen NOT DETECTED NOT DETECTED Final    Comment: (NOTE) The GeneXpert MRSA Assay (FDA approved for NASAL specimens only), is one component of a comprehensive MRSA colonization surveillance program. It is not intended to diagnose MRSA infection nor to guide or monitor treatment for MRSA infections. Test performance is not FDA approved in patients less than 69 years old. Performed at [facility], [address], [place], [place] [address]   Urine Culture (for pregnant, neutropenic or urologic patients or patients with an indwelling urinary catheter)     Status: Abnormal   Collection Time: 10/14/23 10:12 AM   Specimen: Urine, Clean Catch  Result Value Ref Range Status   Specimen Description   Final    URINE, CLEAN CATCH Performed at [facility], [address], [place], [place] [address]    Special Requests   Final    NONE Performed at [facility], [address] [place], [place] [address]    Culture (A)  Final    <10,000 COLONIES/mL INSIGNIFICANT GROWTH Performed at [facility], [address] [place], [place] [address]    Report Status 10/16/2023 FINAL  Final    Labs: CBC: Recent Labs  Lab 10/13/23 2331 10/14/23 0610 10/15/23 0523 10/16/23 0453  WBC 15.8* 13.3* 11.1* 8.6  NEUTROABS 13.7*  --   --   --   HGB 14.4 12.9 12.4 13.0  HCT 43.4 39.9 38.6 40.4  MCV 91.0 93.0 93.0 93.1  PLT 192 169 170 215   Basic Metabolic Panel: Recent Labs  Lab 10/13/23 2331 10/14/23 0610  NA 134* 135  K 3.6 3.7  CL 103 104  CO2 21* 22  GLUCOSE 132* 113*  BUN 11 9  CREATININE 0.61 0.64  CALCIUM 8.9 8.5*   Liver Function Tests: Recent Labs  Lab 10/13/23 2331  AST 11*  ALT 14  ALKPHOS 53  BILITOT 0.8  PROT 7.3  ALBUMIN 4.1   CBG: No results for input(s): "GLUCAP" in the last 168 hours.  Discharge time spent: greater than 30 minutes.  Signed: Kendell Bane, MD [facility] 10/16/2023

## 2023-10-18 LAB — CULTURE, BLOOD (ROUTINE X 2)
Culture: NO GROWTH
Culture: NO GROWTH
Special Requests: ADEQUATE
Special Requests: ADEQUATE
